# Patient Record
Sex: Female | Born: 1975 | Race: White | Hispanic: No | Marital: Married | Smoking: Former smoker
Health system: Southern US, Community
[De-identification: ages and names within clinical notes are randomized; demographics above are authoritative.]

## PROBLEM LIST (undated history)

## (undated) DIAGNOSIS — R519 Headache, unspecified: Secondary | ICD-10-CM

## (undated) DIAGNOSIS — R51 Headache: Secondary | ICD-10-CM

## (undated) DIAGNOSIS — I10 Essential (primary) hypertension: Secondary | ICD-10-CM

## (undated) HISTORY — DX: Headache, unspecified: R51.9

## (undated) HISTORY — PX: WISDOM TOOTH EXTRACTION: SHX21

## (undated) HISTORY — DX: Headache: R51

---

## 2001-11-05 ENCOUNTER — Encounter: Payer: Self-pay | Admitting: Emergency Medicine

## 2001-11-05 ENCOUNTER — Emergency Department (HOSPITAL_COMMUNITY): Admission: EM | Admit: 2001-11-05 | Discharge: 2001-11-05 | Payer: Self-pay | Admitting: Emergency Medicine

## 2010-10-17 ENCOUNTER — Emergency Department (HOSPITAL_COMMUNITY): Admission: EM | Admit: 2010-10-17 | Discharge: 2010-10-17 | Payer: Self-pay | Admitting: Emergency Medicine

## 2011-02-21 LAB — STREP A DNA PROBE: Group A Strep Probe: NEGATIVE

## 2011-02-21 LAB — MONONUCLEOSIS SCREEN: Mono Screen: NEGATIVE

## 2011-02-21 LAB — RAPID STREP SCREEN (MED CTR MEBANE ONLY): Streptococcus, Group A Screen (Direct): NEGATIVE

## 2011-09-14 ENCOUNTER — Other Ambulatory Visit: Payer: Self-pay | Admitting: Family Medicine

## 2011-09-14 DIAGNOSIS — N926 Irregular menstruation, unspecified: Secondary | ICD-10-CM

## 2011-09-14 DIAGNOSIS — R102 Pelvic and perineal pain: Secondary | ICD-10-CM

## 2011-09-18 ENCOUNTER — Other Ambulatory Visit: Payer: Self-pay

## 2011-09-21 ENCOUNTER — Other Ambulatory Visit: Payer: Self-pay

## 2011-09-28 ENCOUNTER — Ambulatory Visit
Admission: RE | Admit: 2011-09-28 | Discharge: 2011-09-28 | Disposition: A | Discharge status: Home / Self Care | Payer: Managed Care, Other (non HMO) | Source: Ambulatory Visit | Attending: Family Medicine | Admitting: Family Medicine

## 2011-09-28 DIAGNOSIS — N926 Irregular menstruation, unspecified: Secondary | ICD-10-CM

## 2011-09-28 DIAGNOSIS — R102 Pelvic and perineal pain: Secondary | ICD-10-CM

## 2012-10-07 IMAGING — US US PELVIS COMPLETE
1 series · 14 of 25 positions shown · non-contrast
Comparison: None.

CLINICAL DATA: Pelvic pain, irregular menses

TRANSABDOMINAL ULTRASOUND OF PELVIS
TECHNIQUE: Transabdominal ultrasound examination of the pelvis was
performed including evaluation of the uterus, ovaries, adnexal
regions, and pelvic cul-de-sac.

[Series 1: us pelvis complete · 0.34mm/px · 14 of 66 slices shown]
[im 1/66]
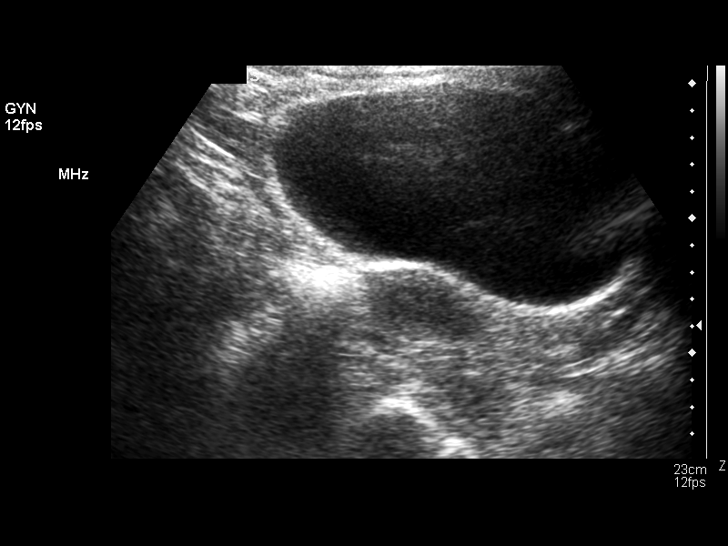
[im 6/66]
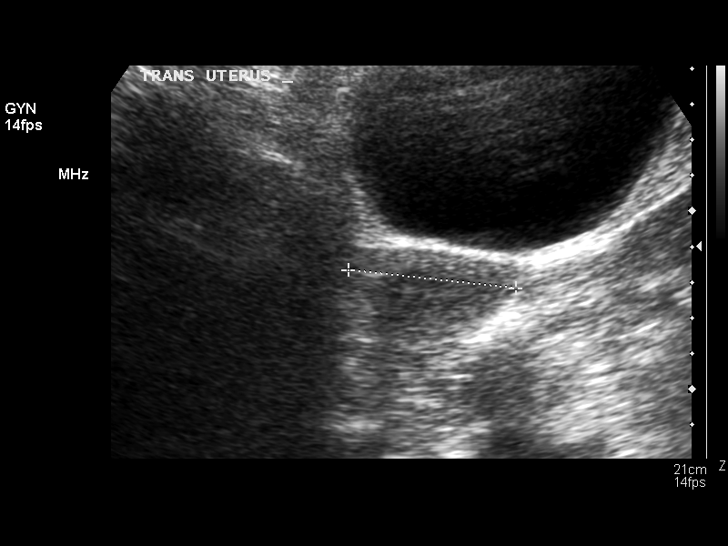
[im 11/66]
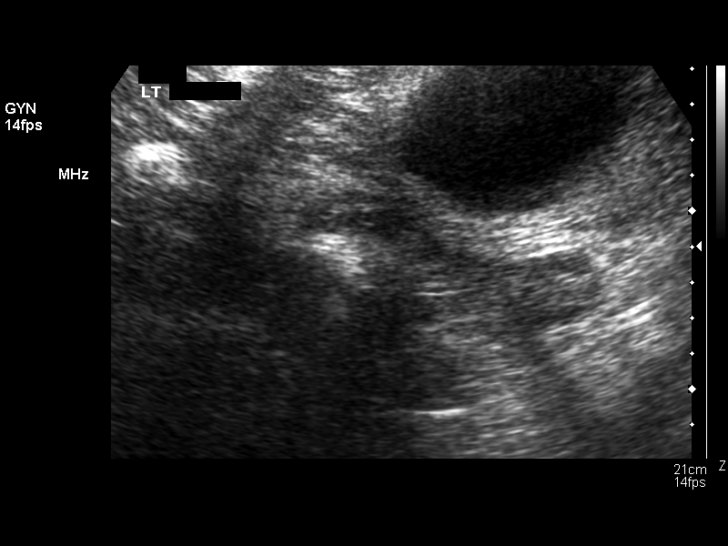
[im 17/66]
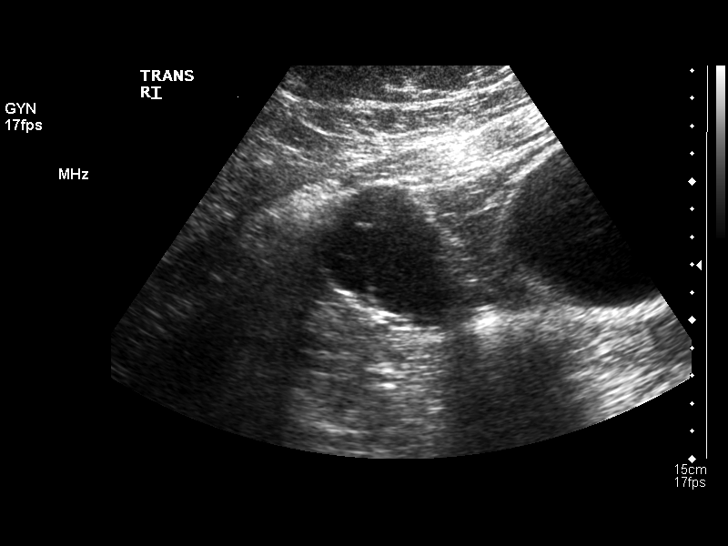
[im 22/66]
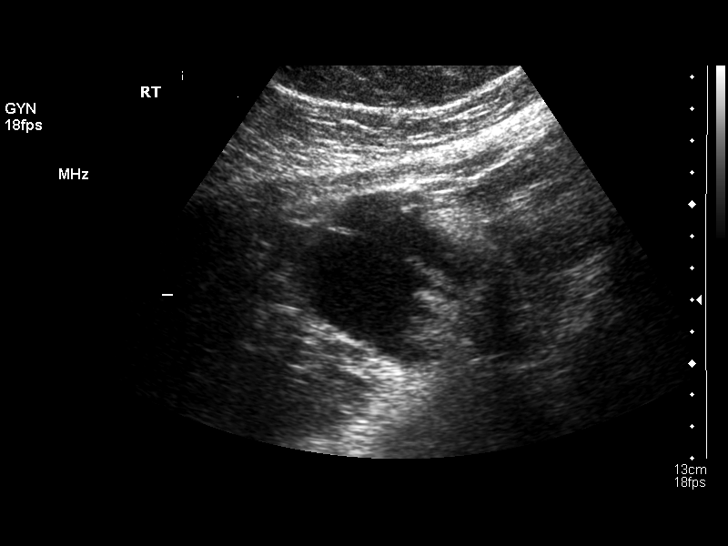
[im 25/66]
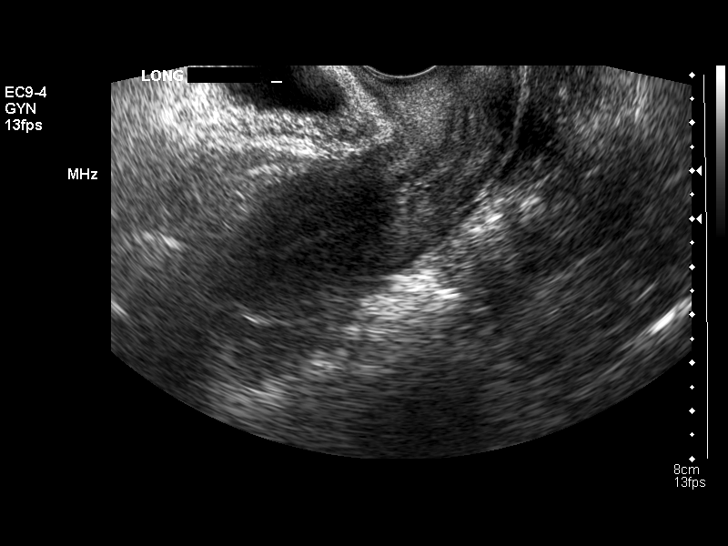
[im 30/66]
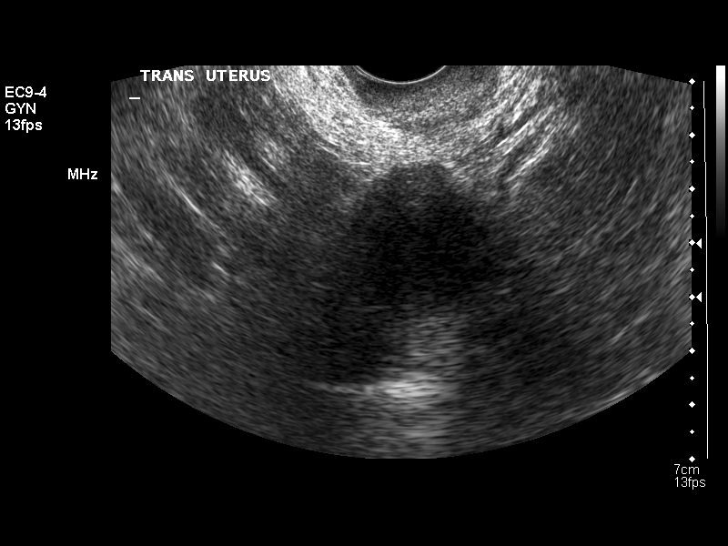
[im 36/66]
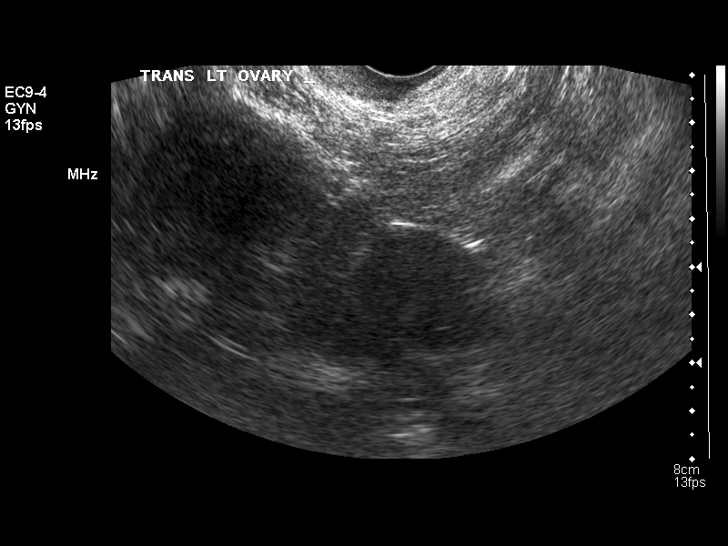
[im 41/66]
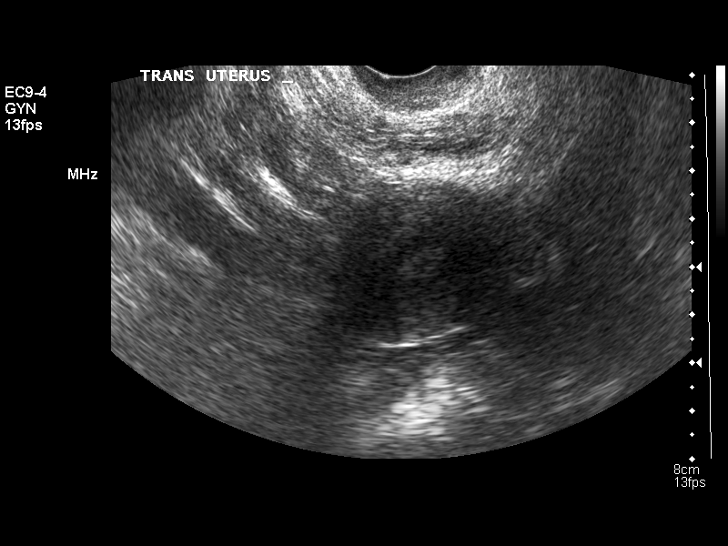
[im 44/66]
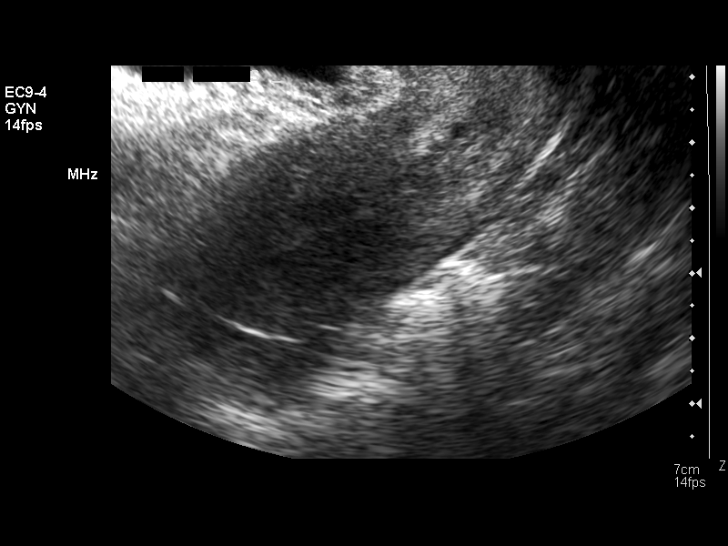
[im 49/66]
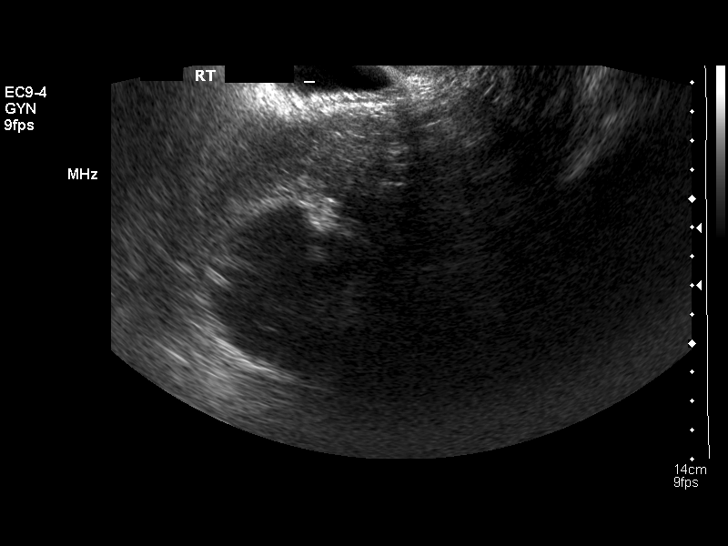
[im 55/66]
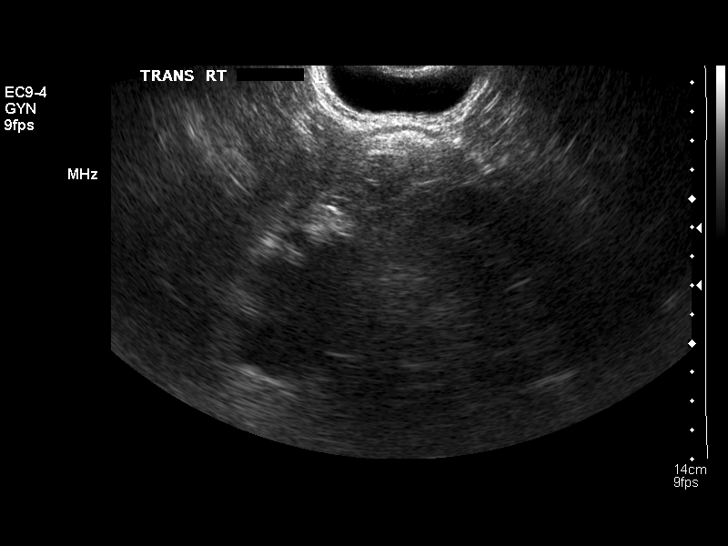
[im 60/66]
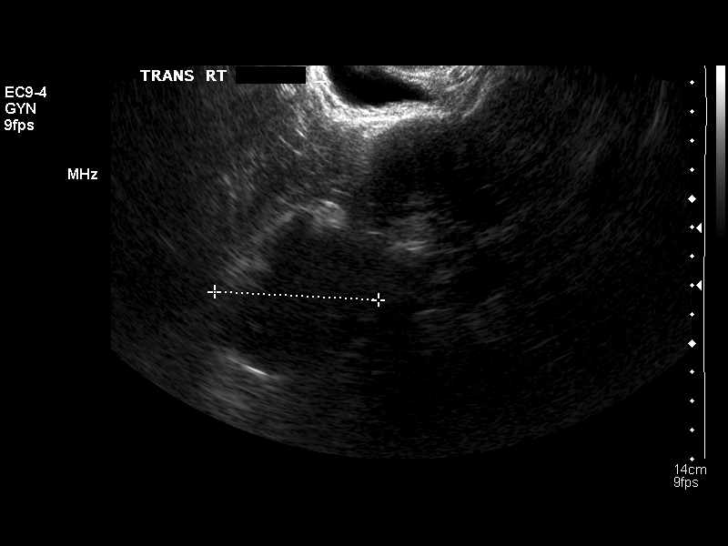
[im 66/66]
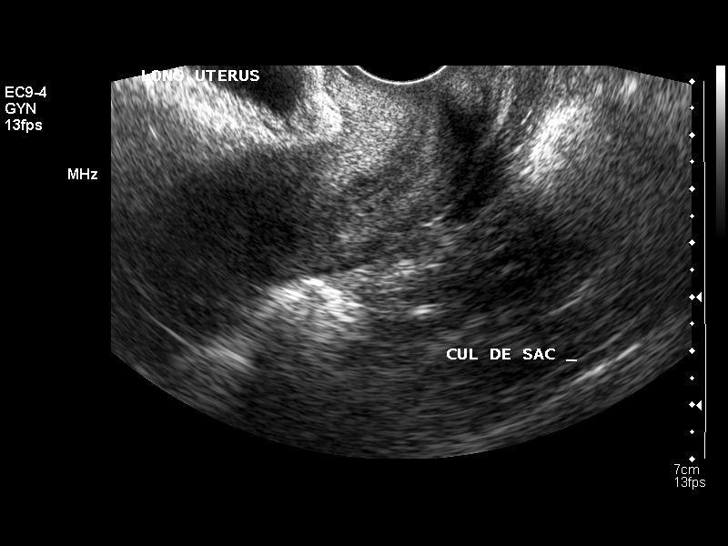

[14 of 25 positions shown; findings below may reference images not displayed]

FINDINGS: Uterus:  Normal size, shape and position. Uterus measures 10.0 x
2.8 x 4.7 cm.  No contour abnormality or fibroid demonstrated.

Endometrium: 3.4 mm thickness.  Normal appearance by ultrasound.
No focal abnormality.

Right ovary: Best demonstrated transabdominally because of anterior
superior location.  Right ovary is enlarged measuring 6.2 x 5.8 x
5.7 cm.  This is secondary to a complex septated right ovarian cyst
measuring 6 x 4 cm.

Left ovary: Measures 3.0 x 2.8 x 3.0 cm.  Normal appearance by
ultrasound.  No focal abnormality or left adnexal finding.

Other Findings:  No free fluid
IMPRESSION: Normal uterus and endometrium.  Negative for fibroids.

6 cm septated complex right ovarian cyst

No free fluid

## 2012-12-23 ENCOUNTER — Emergency Department (HOSPITAL_BASED_OUTPATIENT_CLINIC_OR_DEPARTMENT_OTHER)
Admission: EM | Admit: 2012-12-23 | Discharge: 2012-12-23 | Disposition: A | Discharge status: Home / Self Care | Payer: Managed Care, Other (non HMO) | Attending: Emergency Medicine | Admitting: Emergency Medicine

## 2012-12-23 ENCOUNTER — Encounter (HOSPITAL_BASED_OUTPATIENT_CLINIC_OR_DEPARTMENT_OTHER): Payer: Self-pay | Admitting: *Deleted

## 2012-12-23 DIAGNOSIS — I10 Essential (primary) hypertension: Secondary | ICD-10-CM | POA: Insufficient documentation

## 2012-12-23 DIAGNOSIS — H53149 Visual discomfort, unspecified: Secondary | ICD-10-CM | POA: Insufficient documentation

## 2012-12-23 DIAGNOSIS — R112 Nausea with vomiting, unspecified: Secondary | ICD-10-CM | POA: Insufficient documentation

## 2012-12-23 DIAGNOSIS — R51 Headache: Secondary | ICD-10-CM | POA: Insufficient documentation

## 2012-12-23 DIAGNOSIS — R209 Unspecified disturbances of skin sensation: Secondary | ICD-10-CM | POA: Insufficient documentation

## 2012-12-23 DIAGNOSIS — G43909 Migraine, unspecified, not intractable, without status migrainosus: Secondary | ICD-10-CM | POA: Insufficient documentation

## 2012-12-23 DIAGNOSIS — Z79899 Other long term (current) drug therapy: Secondary | ICD-10-CM | POA: Insufficient documentation

## 2012-12-23 DIAGNOSIS — F17200 Nicotine dependence, unspecified, uncomplicated: Secondary | ICD-10-CM | POA: Insufficient documentation

## 2012-12-23 HISTORY — DX: Essential (primary) hypertension: I10

## 2012-12-23 MED ORDER — SUMATRIPTAN SUCCINATE 50 MG PO TABS: 50.0000 mg | ORAL_TABLET | ORAL | Status: DC | PRN

## 2012-12-23 MED ORDER — METOCLOPRAMIDE HCL 5 MG/ML IJ SOLN
10.0000 mg | Freq: Once | INTRAMUSCULAR | Status: AC
  Administered 2012-12-23: 10 mg via INTRAVENOUS
  Filled 2012-12-23: qty 2

## 2012-12-23 MED ORDER — PROMETHAZINE HCL 25 MG/ML IJ SOLN
25.0000 mg | Freq: Once | INTRAMUSCULAR | Status: AC
  Administered 2012-12-23: 25 mg via INTRAVENOUS
  Filled 2012-12-23: qty 1

## 2012-12-23 MED ORDER — DEXAMETHASONE SODIUM PHOSPHATE 10 MG/ML IJ SOLN
10.0000 mg | Freq: Once | INTRAMUSCULAR | Status: AC
  Administered 2012-12-23: 10 mg via INTRAVENOUS
  Filled 2012-12-23: qty 1

## 2012-12-23 MED ORDER — SODIUM CHLORIDE 0.9 % IV SOLN
INTRAVENOUS | Status: DC
  Administered 2012-12-23: 18:00:00 via INTRAVENOUS

## 2012-12-23 MED ORDER — DIPHENHYDRAMINE HCL 50 MG/ML IJ SOLN
25.0000 mg | Freq: Once | INTRAMUSCULAR | Status: AC
  Administered 2012-12-23: 25 mg via INTRAVENOUS
  Filled 2012-12-23: qty 1

## 2012-12-23 MED ORDER — HYDROMORPHONE HCL PF 1 MG/ML IJ SOLN
1.0000 mg | Freq: Once | INTRAMUSCULAR | Status: AC
  Administered 2012-12-23: 1 mg via INTRAVENOUS
  Filled 2012-12-23: qty 1

## 2012-12-23 NOTE — ED Notes (Signed)
Pt c/o " migraine" x 2 hrs  

## 2012-12-23 NOTE — ED Provider Notes (Signed)
History   This chart was scribed for Claudia Human, MD by Donne Anon, ED Scribe. This patient was seen in room MH09/MH09 and the patient's care was started at 1748.   CSN: [id number]  Arrival date & time 12/23/12  1739   First MD Initiated Contact with Patient 12/23/12 1748      Chief Complaint  Patient presents with  . Migraine     Patient is a 37 y.o. female presenting with migraines. The history is provided by the patient. No language interpreter was used.  Migraine This is a new problem. The current episode started 1 to 2 hours ago. The problem occurs constantly. The problem has not changed since onset.Associated symptoms include headaches.   Labrenda Lasky is a 37 y.o. female who presents to the Emergency Department complaining of a gradual onset, constant, moderate frontal migraine which began 2 hours PTA. She reports associated emesis (1 episode) and photophobia. She states she has had similar migraines in the past and this one feels similar but she has not had any recently. She reports associated numbness in her left thigh which began 2 weeks ago. She reports she has recently been diagnosed with HTN and is otherwise in good health. She has a family history of migraines. Pt is a current everyday smoker (1pack/day) but denies alcohol use.   Past Medical History  Diagnosis Date  . Hypertension     History reviewed. No pertinent past surgical history.  History reviewed. No pertinent family history.  History  Substance Use Topics  . Smoking status: Current Every Day Smoker -- 0.5 packs/day    Types: Cigarettes  . Smokeless tobacco: Not on file  . Alcohol Use: No    OB History    Grav Para Term Preterm Abortions TAB SAB Ect Mult Living                  Review of Systems  Constitutional: Negative for fever.  HENT: Negative for sore throat.   Gastrointestinal: Positive for vomiting.  Neurological: Positive for numbness (left thigh) and headaches.  All other  systems reviewed and are negative.    Allergies  Review of patient's allergies indicates no known allergies.  Home Medications   Current Outpatient Rx  Name  Route  Sig  Dispense  Refill  . LISINOPRIL 10 MG PO TABS   Oral   Take 15 mg by mouth daily.           Triage Vitals: BP 158/90  Pulse 75  Temp 97.9 F (36.6 C) (Oral)  Resp 16  Ht 5\' 9"  (1.753 m)  Wt 250 lb (113.399 kg)  BMI 36.92 kg/m2  SpO2 100%  LMP 12/21/2012  Physical Exam  Nursing note and vitals reviewed. Constitutional: She is oriented to person, place, and time. She appears well-developed and well-nourished. No distress.  HENT:  Head: Normocephalic and atraumatic.  Right Ear: External ear normal.  Left Ear: External ear normal.  Eyes: Conjunctivae normal are normal. Pupils are equal, round, and reactive to light. Right eye exhibits no discharge. Left eye exhibits no discharge. No scleral icterus.  Neck: Normal range of motion. Neck supple. No tracheal deviation present.  Cardiovascular: Normal rate, regular rhythm, normal heart sounds and intact distal pulses.   Pulmonary/Chest: Effort normal and breath sounds normal. No stridor. No respiratory distress. She has no wheezes. She has no rales.  Abdominal: Soft. Bowel sounds are normal. She exhibits no distension. There is no tenderness. There is no rebound and  no guarding.  Musculoskeletal: Normal range of motion. She exhibits no edema and no tenderness.  Neurological: She is alert and oriented to person, place, and time. She has normal strength. No sensory deficit. Cranial nerve deficit:  no gross defecits noted. She exhibits normal muscle tone. She displays no seizure activity. Coordination normal.  Skin: Skin is warm and dry. No rash noted.  Psychiatric: She has a normal mood and affect. Her behavior is normal.    ED Course  Procedures (including critical care time) DIAGNOSTIC STUDIES: Oxygen Saturation is 100% on room air, normal by my  interpretation.    COORDINATION OF CARE: 5:51 PM Discussed treatment plan which includes medication with pt at bedside and pt agreed to plan.   Meds ordered this encounter  Medications  . lisinopril (PRINIVIL,ZESTRIL) 10 MG tablet    Sig: Take 15 mg by mouth daily.  Marland Kitchen 0.9 %  sodium chloride infusion    Sig:   . metoCLOPramide (REGLAN) injection 10 mg    Sig:   . dexamethasone (DECADRON) injection 10 mg    Sig:   . diphenhydrAMINE (BENADRYL) injection 25 mg    Sig:     7:30 PM No relief with migraine cocktail.  Rx Dilaudid 1 mg IV and Phenergan 25 mg IV as rescue medication.  8:46 PM Headache relieved.  Released with Rx for Imitrex 50 mg prn migraine headache.  F/U with her PCP at [facility].   IMP:  Migraine headache.  I personally performed the services described in this documentation, which was scribed in my presence. The recorded information has been reviewed and is accurate.  Claudia Human, MD      Carleene Cooper III, MD 12/23/12 [phone]

## 2013-01-02 LAB — HM PAP SMEAR: HM Pap smear: NORMAL

## 2015-01-04 ENCOUNTER — Emergency Department: Payer: Self-pay | Admitting: Emergency Medicine

## 2015-09-08 ENCOUNTER — Ambulatory Visit: Payer: Managed Care, Other (non HMO) | Admitting: Internal Medicine

## 2015-09-14 ENCOUNTER — Encounter: Payer: Self-pay | Admitting: Internal Medicine

## 2015-09-14 ENCOUNTER — Ambulatory Visit (INDEPENDENT_AMBULATORY_CARE_PROVIDER_SITE_OTHER): Payer: Managed Care, Other (non HMO) | Admitting: Internal Medicine

## 2015-09-14 ENCOUNTER — Encounter (INDEPENDENT_AMBULATORY_CARE_PROVIDER_SITE_OTHER): Payer: Self-pay

## 2015-09-14 VITALS — BP 144/100 | HR 79 | Temp 98.4°F | Ht 67.0 in | Wt 251.0 lb

## 2015-09-14 DIAGNOSIS — R519 Headache, unspecified: Secondary | ICD-10-CM

## 2015-09-14 DIAGNOSIS — I10 Essential (primary) hypertension: Secondary | ICD-10-CM

## 2015-09-14 DIAGNOSIS — L989 Disorder of the skin and subcutaneous tissue, unspecified: Secondary | ICD-10-CM

## 2015-09-14 DIAGNOSIS — R51 Headache: Secondary | ICD-10-CM

## 2015-09-14 LAB — COMPREHENSIVE METABOLIC PANEL
ALBUMIN: 4 g/dL (ref 3.5–5.2)
ALT: 22 U/L (ref 0–35)
AST: 18 U/L (ref 0–37)
Alkaline Phosphatase: 117 U/L (ref 39–117)
BILIRUBIN TOTAL: 0.4 mg/dL (ref 0.2–1.2)
BUN: 16 mg/dL (ref 6–23)
CALCIUM: 9.7 mg/dL (ref 8.4–10.5)
CHLORIDE: 104 meq/L (ref 96–112)
CO2: 30 mEq/L (ref 19–32)
CREATININE: 0.75 mg/dL (ref 0.40–1.20)
GFR: 91.5 mL/min (ref >60.00)
Glucose, Bld: 92 mg/dL (ref 70–99)
Potassium: 4.6 mEq/L (ref 3.5–5.1)
SODIUM: 138 meq/L (ref 135–145)
Total Protein: 6.9 g/dL (ref 6.0–8.3)

## 2015-09-14 LAB — CBC
HCT: 45.4 % (ref 36.0–46.0)
Hemoglobin: 15 g/dL (ref 12.0–15.0)
MCHC: 33 g/dL (ref 30.0–36.0)
MCV: 92.6 fl (ref 78.0–100.0)
PLATELETS: 251 10*3/uL (ref 150.0–400.0)
RBC: 4.91 Mil/uL (ref 3.87–5.11)
RDW: 14.1 % (ref 11.5–15.5)
WBC: 11.2 10*3/uL — AB (ref 4.0–10.5)

## 2015-09-14 NOTE — Progress Notes (Signed)
Pre visit review using our clinic review tool, if applicable. No additional management support is needed unless otherwise documented below in the visit note. 

## 2015-09-14 NOTE — Patient Instructions (Signed)

## 2015-09-14 NOTE — Assessment & Plan Note (Signed)
Appears hormonal Discussed starting OCP's to see if this will help She has an appt with GYN in 2 weeks, she would like to discuss with them

## 2015-09-14 NOTE — Progress Notes (Signed)
HPI  Pt presents to the clinic today to establish care and for management of the conditions listed below. Claudia Glenn is transferring care from [facility]:  HTN: Claudia Glenn has been prescribed Lisinopril in the past. Claudia Glenn reports it caused severe chest tightness. Claudia Glenn was switched to HCTZ but stopped taking it because it made her urinate too frequently. Claudia Glenn has not taken any BP medication in the last 6 months. Her BP today is 144/100.  Frequent Headaches: The start in her forehead. It radiates down to the back of her head. Claudia Glenn reports it feels like her head is about to bust open. Sometimes, Claudia Glenn has some associated nausea and vomiting. Claudia Glenn has sensitivity to light and sound. Claudia Glenn reports this occurs about once per month. Claudia Glenn reports it seems to be triggered by her periods. Claudia Glenn takes Advil liquid gels with some relief. Claudia Glenn has been prescribed Imitrex in the past but reports Claudia Glenn never did try it.  Claudia Glenn has a place on the left side of her face. Claudia Glenn noticed in 1-2 years ago. Claudia Glenn reports it is scaly. It has not changed in shape, size or color. Claudia Glenn has never had this looked at.  Claudia Glenn also reports a red patch on her right inner thigh. Claudia Glenn noticed this about 6 months ago. Claudia Glenn reports that it does not itch or burn.  Claudia Glenn reports it has not changes in color, shape or size. Claudia Glenn has not tried anything OTC.  Flu: never Tetanus: within the last 5 years LMP: 09/07/2015 Pap Smear: 2014, has appt on 10/18          Dentist: yearly  Past Medical History  Diagnosis Date  . Hypertension   . Frequent headaches     Current Outpatient Prescriptions  Medication Sig Dispense Refill  . lisinopril (PRINIVIL,ZESTRIL) 10 MG tablet Take 15 mg by mouth daily.     No current facility-administered medications for this visit.    No Known Allergies  Family History  Problem Relation Age of Onset  . Irregular heart beat Mother     Social History   Social History  . Marital Status: Married    Spouse Name: N/A  .  Number of Children: N/A  . Years of Education: N/A   Occupational History  . Not on file.   Social History Main Topics  . Smoking status: Former Smoker -- 0.50 packs/day    Types: Cigarettes  . Smokeless tobacco: Never Used     Comment: quit 2014  . Alcohol Use: 0.0 oz/week    0 Standard drinks or equivalent per week     Comment: rare  . Drug Use: No  . Sexual Activity: No   Other Topics Concern  . Not on file   Social History Narrative    ROS:  Constitutional: Pt reports headaches. Denies fever, malaise, fatigue, or abrupt weight changes.  HEENT: Denies eye pain, eye redness, ear pain, ringing in the ears, wax buildup, runny nose, nasal congestion, bloody nose, or sore throat. Respiratory: Denies difficulty breathing, shortness of breath, cough or sputum production.   Cardiovascular: Denies chest pain, chest tightness, palpitations or swelling in the hands or feet.  Gastrointestinal: Denies abdominal pain, bloating, constipation, diarrhea or blood in the stool.  GU: Denies frequency, urgency, pain with urination, blood in urine, odor or discharge. Musculoskeletal: Denies decrease in range of motion, difficulty with gait, muscle pain or joint pain and swelling.  Skin: Pt reports skin lesion on face and right leg. Denies ulcercations.  Neurological: Denies dizziness,  difficulty with memory, difficulty with speech or problems with balance and coordination.  Psych: Denies anxiety, depression, SI/HI.  No other specific complaints in a complete review of systems (except as listed in HPI above).  PE:  BP 144/100 mmHg  Pulse 79  Temp(Src) 98.4 F (36.9 C) (Oral)  Ht 5' 7"  (1.702 m)  Wt 251 lb (113.853 kg)  BMI 39.30 kg/m2  SpO2 98%  LMP 09/07/2015  Wt Readings from Last 3 Encounters:  09/14/15 251 lb (113.853 kg)  12/23/12 250 lb (113.399 kg)    General: Appears her stated age, obese in NAD. Skin: Round, raised, oval scaly lesion noted on left side of face. Grouped  mauclopapular lesions noted on medial right thigh. Cardiovascular: Normal rate and rhythm. S1,S2 noted.  No murmur, rubs or gallops noted.  Pulmonary/Chest: Normal effort and positive vesicular breath sounds. No respiratory distress. No wheezes, rales or ronchi noted.  Neurological: Alert and oriented. Coordination normal.  Psychiatric: Mood and affect normal. Behavior is normal. Judgment and thought content normal.    Assessment and Plan:  Skin lesion of face and leg:  Claudia Glenn will self refer to Dermatology Avoid picking at them if you can  RTC in 1 month to follow up BP

## 2015-09-14 NOTE — Assessment & Plan Note (Signed)
Will start Losartan 25 mg daily ECG today normal CBC and CMET today

## 2015-09-15 ENCOUNTER — Telehealth: Payer: Self-pay

## 2015-09-15 MED ORDER — LOSARTAN POTASSIUM 25 MG PO TABS: 25.0000 mg | ORAL_TABLET | Freq: Every day | ORAL | Status: DC

## 2015-09-15 NOTE — Telephone Encounter (Signed)
Pt seen 09/14/15; pt said was supposed to start losartan; but not at pharmacy. Spoke with pt and apologized and advised to ck with H/T [place] [phone] x 0. Pt voiced understanding. Pt has 1 month f/u appt on 10/19/15.sending to Avie Echevaria NP as Juluis Rainier.

## 2015-09-28 ENCOUNTER — Ambulatory Visit (INDEPENDENT_AMBULATORY_CARE_PROVIDER_SITE_OTHER): Payer: Managed Care, Other (non HMO) | Admitting: Obstetrics & Gynecology

## 2015-09-28 ENCOUNTER — Telehealth: Payer: Self-pay | Admitting: *Deleted

## 2015-09-28 ENCOUNTER — Encounter: Payer: Self-pay | Admitting: Obstetrics & Gynecology

## 2015-09-28 VITALS — BP 154/108 | HR 88 | Resp 20 | Ht 67.0 in | Wt 252.0 lb

## 2015-09-28 DIAGNOSIS — Z1151 Encounter for screening for human papillomavirus (HPV): Secondary | ICD-10-CM | POA: Diagnosis not present

## 2015-09-28 DIAGNOSIS — N946 Dysmenorrhea, unspecified: Secondary | ICD-10-CM | POA: Diagnosis not present

## 2015-09-28 DIAGNOSIS — Z01419 Encounter for gynecological examination (general) (routine) without abnormal findings: Secondary | ICD-10-CM | POA: Diagnosis not present

## 2015-09-28 DIAGNOSIS — Z124 Encounter for screening for malignant neoplasm of cervix: Secondary | ICD-10-CM | POA: Diagnosis not present

## 2015-09-28 DIAGNOSIS — Z0000 Encounter for general adult medical examination without abnormal findings: Secondary | ICD-10-CM

## 2015-09-28 LAB — TSH: TSH: 1.072 u[IU]/mL (ref 0.350–4.500)

## 2015-09-28 NOTE — Telephone Encounter (Signed)
Patient left a voicemail stating that she was in a couple of weeks and started on a blood pressure medication. Patient stated that she saw her GYN today and her BP was 160/10 and her GYN was concerned. Patient was told to call and report this to you and see what she should do about this?  Patient stated that she does not know if she needs a stronger dose of medication or something different. Patient stated that she works third shift and is getting ready to take a nap. Patient stated that if you don't get her when you call back she will try to call you before the office closes today.

## 2015-09-28 NOTE — Progress Notes (Signed)
Subjective:    Claudia Glenn is a MW P0  39 y.o. female who presents for an annual exam. The patient has no complaints today. She says that for the last few years her periods have gotten very heavy and painful, especially the first 3-4 days of her 7 day period. She has not used contraception since marriage in 2001. The patient is sexually active. GYN screening history: last pap: was normal. The patient wears seatbelts: yes. The patient participates in regular exercise: yes. Has the patient ever been transfused or tattooed?: yes. The patient reports that there is not domestic violence in her life.   Menstrual History: OB History    No data available      Menarche age: 62  Patient's last menstrual period was 09/07/2015.    The following portions of the patient's history were reviewed and updated as appropriate: allergies, current medications, past family history, past medical history, past social history, past surgical history and problem list.  Review of Systems Pertinent items noted in HPI and remainder of comprehensive ROS otherwise negative. She works for [employer] on [employer]. Married for about 14 years, denies dyspareunia. She will get her flu vaccine at work.    Objective:    BP 154/108 mmHg  Pulse 88  Resp 20  Ht 5' 7"  (1.702 m)  Wt 252 lb (114.306 kg)  BMI 39.46 kg/m2  LMP 09/07/2015  General Appearance:    Alert, cooperative, no distress, appears stated age  Head:    Normocephalic, without obvious abnormality, atraumatic  Eyes:    PERRL, conjunctiva/corneas clear, EOM's intact, fundi    benign, both eyes  Ears:    Normal TM's and external ear canals, both ears  Nose:   Nares normal, septum midline, mucosa normal, no drainage    or sinus tenderness  Throat:   Lips, mucosa, and tongue normal; teeth and gums normal  Neck:   Supple, symmetrical, trachea midline, no adenopathy;    thyroid:  no enlargement/tenderness/nodules; no carotid   bruit or JVD  Back:      Symmetric, no curvature, ROM normal, no CVA tenderness  Lungs:     Clear to auscultation bilaterally, respirations unlabored  Chest Wall:    No tenderness or deformity   Heart:    Regular rate and rhythm, S1 and S2 normal, no murmur, rub   or gallop  Breast Exam:    No tenderness, masses, or nipple abnormality  Abdomen:     Soft, non-tender, bowel sounds active all four quadrants,    no masses, no organomegaly, obese  Genitalia:    Normal female without lesion, discharge or tenderness, ULN size, difficult exam, no palpable masses     Extremities:   Extremities normal, atraumatic, no cyanosis or edema  Pulses:   2+ and symmetric all extremities  Skin:   Skin color, texture, turgor normal, no rashes or lesions  Lymph nodes:   Cervical, supraclavicular, and axillary nodes normal  Neurologic:   CNII-XII intact, normal strength, sensation and reflexes    throughout  .    Assessment:    Healthy female exam.   dysmenorrhea   Plan:     Breast self exam technique reviewed and patient encouraged to perform self-exam monthly. Thin prep Pap smear.   tsh Gyn u/s

## 2015-09-28 NOTE — Telephone Encounter (Signed)
I want her to come back here for a followup

## 2015-09-28 NOTE — Telephone Encounter (Signed)
Pt has f/u appt scheduled for tomorrow at 3:45

## 2015-09-29 ENCOUNTER — Ambulatory Visit (INDEPENDENT_AMBULATORY_CARE_PROVIDER_SITE_OTHER): Payer: Managed Care, Other (non HMO) | Admitting: Internal Medicine

## 2015-09-29 ENCOUNTER — Encounter: Payer: Self-pay | Admitting: Internal Medicine

## 2015-09-29 VITALS — BP 154/98 | HR 105 | Temp 98.1°F | Wt 250.0 lb

## 2015-09-29 DIAGNOSIS — I10 Essential (primary) hypertension: Secondary | ICD-10-CM | POA: Diagnosis not present

## 2015-09-29 LAB — CYTOLOGY - PAP

## 2015-09-29 MED ORDER — LOSARTAN POTASSIUM 100 MG PO TABS: 100.0000 mg | ORAL_TABLET | Freq: Every day | ORAL | Status: DC

## 2015-09-29 NOTE — Assessment & Plan Note (Signed)
Increase Losartan to 100 mg daily Discussed stress relieving techniques If you any chest pain or shortness of breath, to ER immediately  RTC in 2 weeks for followup

## 2015-09-29 NOTE — Patient Instructions (Signed)
Hypertension Hypertension, commonly called high blood pressure, is when the force of blood pumping through your arteries is too strong. Your arteries are the blood vessels that carry blood from your heart throughout your body. A blood pressure reading consists of a higher number over a lower number, such as 110/72. The higher number (systolic) is the pressure inside your arteries when your heart pumps. The lower number (diastolic) is the pressure inside your arteries when your heart relaxes. Ideally you want your blood pressure below 120/80. Hypertension forces your heart to work harder to pump blood. Your arteries may become narrow or stiff. Having untreated or uncontrolled hypertension can cause heart attack, stroke, kidney disease, and other problems. RISK FACTORS Some risk factors for high blood pressure are controllable. Others are not.  Risk factors you cannot control include:   Race. You may be at higher risk if you are African American.  Age. Risk increases with age.  Gender. Men are at higher risk than women before age 45 years. After age 65, women are at higher risk than men. Risk factors you can control include:  Not getting enough exercise or physical activity.  Being overweight.  Getting too much fat, sugar, calories, or salt in your diet.  Drinking too much alcohol. SIGNS AND SYMPTOMS Hypertension does not usually cause signs or symptoms. Extremely high blood pressure (hypertensive crisis) may cause headache, anxiety, shortness of breath, and nosebleed. DIAGNOSIS To check if you have hypertension, your health care provider will measure your blood pressure while you are seated, with your arm held at the level of your heart. It should be measured at least twice using the same arm. Certain conditions can cause a difference in blood pressure between your right and left arms. A blood pressure reading that is higher than normal on one occasion does not mean that you need treatment. If  it is not clear whether you have high blood pressure, you may be asked to return on a different day to have your blood pressure checked again. Or, you may be asked to monitor your blood pressure at home for 1 or more weeks. TREATMENT Treating high blood pressure includes making lifestyle changes and possibly taking medicine. Living a healthy lifestyle can help lower high blood pressure. You may need to change some of your habits. Lifestyle changes may include:  Following the DASH diet. This diet is high in fruits, vegetables, and whole grains. It is low in salt, red meat, and added sugars.  Keep your sodium intake below 2,300 mg per day.  Getting at least 30-45 minutes of aerobic exercise at least 4 times per week.  Losing weight if necessary.  Not smoking.  Limiting alcoholic beverages.  Learning ways to reduce stress. Your health care provider may prescribe medicine if lifestyle changes are not enough to get your blood pressure under control, and if one of the following is true:  You are 18-59 years of age and your systolic blood pressure is above 140.  You are 60 years of age or older, and your systolic blood pressure is above 150.  Your diastolic blood pressure is above 90.  You have diabetes, and your systolic blood pressure is over 140 or your diastolic blood pressure is over 90.  You have kidney disease and your blood pressure is above 140/90.  You have heart disease and your blood pressure is above 140/90. Your personal target blood pressure may vary depending on your medical conditions, your age, and other factors. HOME CARE INSTRUCTIONS    Have your blood pressure rechecked as directed by your health care provider.   Take medicines only as directed by your health care provider. Follow the directions carefully. Blood pressure medicines must be taken as prescribed. The medicine does not work as well when you skip doses. Skipping doses also puts you at risk for  problems.  Do not smoke.   Monitor your blood pressure at home as directed by your health care provider. SEEK MEDICAL CARE IF:   You think you are having a reaction to medicines taken.  You have recurrent headaches or feel dizzy.  You have swelling in your ankles.  You have trouble with your vision. SEEK IMMEDIATE MEDICAL CARE IF:  You develop a severe headache or confusion.  You have unusual weakness, numbness, or feel faint.  You have severe chest or abdominal pain.  You vomit repeatedly.  You have trouble breathing. MAKE SURE YOU:   Understand these instructions.  Will watch your condition.  Will get help right away if you are not doing well or get worse.   This information is not intended to replace advice given to you by your health care provider. Make sure you discuss any questions you have with your health care provider.   Document Released: 11/27/2005 Document Revised: 04/13/2015 Document Reviewed: 09/19/2013 Elsevier Interactive Patient Education 2016 [employer]  

## 2015-09-29 NOTE — Progress Notes (Signed)
Pre visit review using our clinic review tool, if applicable. No additional management support is needed unless otherwise documented below in the visit note. 

## 2015-09-29 NOTE — Progress Notes (Signed)
Subjective:    Patient ID: Claudia Glenn, female    DOB: 02-20-1976, 39 y.o.   MRN: [id number]  HPI  Pt presents to the clinic today with c/o elevated blood pressure. She was started on Losartan for a BP of 144/100. She went to the GYN on 10/18 at which time her BP was 154/108. She has noticed a constant heaviness in her chest but she denies chest pain or palpitations. She has been under a lot of stress at work lately. ECG from 09/2015 reviewed. She has failed Lisinopril and HCTZ in the past. Her BP today is 154/98.  Review of Systems      Past Medical History  Diagnosis Date  . Hypertension   . Frequent headaches     Current Outpatient Prescriptions  Medication Sig Dispense Refill  . losartan (COZAAR) 25 MG tablet Take 1 tablet (25 mg total) by mouth daily. 40 tablet 0   No current facility-administered medications for this visit.    No Known Allergies  Family History  Problem Relation Age of Onset  . Irregular heart beat Mother   . Cancer Neg Hx   . Diabetes Neg Hx   . Heart disease Neg Hx   . Stroke Neg Hx     Social History   Social History  . Marital Status: Married    Spouse Name: N/A  . Number of Children: N/A  . Years of Education: N/A   Occupational History  . Not on file.   Social History Main Topics  . Smoking status: Former Smoker -- 0.50 packs/day    Types: Cigarettes  . Smokeless tobacco: Never Used     Comment: quit 2014  . Alcohol Use: 0.0 oz/week    0 Standard drinks or equivalent per week     Comment: rare  . Drug Use: No  . Sexual Activity: Yes    Birth Control/ Protection: None   Other Topics Concern  . Not on file   Social History Narrative     Constitutional: Denies fever, malaise, fatigue, headache or abrupt weight changes.  Respiratory: Denies difficulty breathing, shortness of breath, cough or sputum production.   Cardiovascular: Pt reports chest tightness. Denies chest pain, palpitations or swelling in the hands or feet.   Neurological: Denies dizziness, difficulty with memory, difficulty with speech or problems with balance and coordination.  Psych: Denies anxiety, depression, SI/HI.  No other specific complaints in a complete review of systems (except as listed in HPI above).  Objective:   Physical Exam  BP 154/98 mmHg  Pulse 105  Temp(Src) 98.1 F (36.7 C) (Oral)  Wt 250 lb (113.399 kg)  SpO2 98%  LMP 09/07/2015 Wt Readings from Last 3 Encounters:  09/29/15 250 lb (113.399 kg)  09/28/15 252 lb (114.306 kg)  09/14/15 251 lb (113.853 kg)    General: Appears her stated age, obese in NAD. Cardiovascular: Normal rate and rhythm. S1,S2 noted.  No murmur, rubs or gallops noted.  Pulmonary/Chest: Normal effort and positive vesicular breath sounds. No respiratory distress. No wheezes, rales or ronchi noted.  Neurological: Alert and oriented. Cranial nerves II-XII grossly  intact. Coordination normal.    BMET    Component Value Date/Time   NA 138 09/14/2015 1148   K 4.6 09/14/2015 1148   CL 104 09/14/2015 1148   CO2 30 09/14/2015 1148   GLUCOSE 92 09/14/2015 1148   BUN 16 09/14/2015 1148   CREATININE 0.75 09/14/2015 1148   CALCIUM 9.7 09/14/2015 1148    Lipid  Panel  No results found for: CHOL, TRIG, HDL, CHOLHDL, VLDL, LDLCALC  CBC    Component Value Date/Time   WBC 11.2* 09/14/2015 1148   RBC 4.91 09/14/2015 1148   HGB 15.0 09/14/2015 1148   HCT 45.4 09/14/2015 1148   PLT 251.0 09/14/2015 1148   MCV 92.6 09/14/2015 1148   MCHC 33.0 09/14/2015 1148   RDW 14.1 09/14/2015 1148    Hgb A1C No results found for: HGBA1C        Assessment & Plan:

## 2015-10-02 ENCOUNTER — Emergency Department
Admission: EM | Admit: 2015-10-02 | Discharge: 2015-10-02 | Disposition: A | Discharge status: Home / Self Care | Payer: Managed Care, Other (non HMO) | Attending: Emergency Medicine | Admitting: Emergency Medicine

## 2015-10-02 ENCOUNTER — Encounter: Payer: Self-pay | Admitting: *Deleted

## 2015-10-02 DIAGNOSIS — Z79899 Other long term (current) drug therapy: Secondary | ICD-10-CM | POA: Diagnosis not present

## 2015-10-02 DIAGNOSIS — R0789 Other chest pain: Secondary | ICD-10-CM | POA: Diagnosis not present

## 2015-10-02 DIAGNOSIS — Z87891 Personal history of nicotine dependence: Secondary | ICD-10-CM | POA: Insufficient documentation

## 2015-10-02 DIAGNOSIS — I10 Essential (primary) hypertension: Secondary | ICD-10-CM | POA: Diagnosis not present

## 2015-10-02 DIAGNOSIS — R079 Chest pain, unspecified: Secondary | ICD-10-CM | POA: Diagnosis present

## 2015-10-02 LAB — BASIC METABOLIC PANEL
Anion gap: 7 (ref 5–15)
BUN: 14 mg/dL (ref 6–20)
CALCIUM: 9.1 mg/dL (ref 8.9–10.3)
CHLORIDE: 104 mmol/L (ref 101–111)
CO2: 24 mmol/L (ref 22–32)
CREATININE: 0.71 mg/dL (ref 0.44–1.00)
Glucose, Bld: 105 mg/dL — ABNORMAL HIGH (ref 65–99)
Potassium: 3.9 mmol/L (ref 3.5–5.1)
SODIUM: 135 mmol/L (ref 135–145)

## 2015-10-02 LAB — TROPONIN I

## 2015-10-02 LAB — CBC
HCT: 45.3 % (ref 35.0–47.0)
HEMOGLOBIN: 15.1 g/dL (ref 12.0–16.0)
MCH: 30.5 pg (ref 26.0–34.0)
MCHC: 33.4 g/dL (ref 32.0–36.0)
MCV: 91.2 fL (ref 80.0–100.0)
PLATELETS: 224 10*3/uL (ref 150–440)
RBC: 4.97 MIL/uL (ref 3.80–5.20)
RDW: 14.3 % (ref 11.5–14.5)
WBC: 10.8 10*3/uL (ref 3.6–11.0)

## 2015-10-02 MED ORDER — ASPIRIN 81 MG PO CHEW
324.0000 mg | CHEWABLE_TABLET | Freq: Once | ORAL | Status: AC
  Administered 2015-10-02: 324 mg via ORAL
  Filled 2015-10-02: qty 4

## 2015-10-02 MED ORDER — LABETALOL HCL 100 MG PO TABS
100.0000 mg | ORAL_TABLET | Freq: Once | ORAL | Status: AC
  Administered 2015-10-02: 100 mg via ORAL
  Filled 2015-10-02: qty 1

## 2015-10-02 NOTE — ED Notes (Signed)
Pt reports chest tightness for the last 2 days , pt reports hypertension; pt had recent medication change wit Losartan

## 2015-10-02 NOTE — ED Provider Notes (Signed)
[facility] Emergency Department Claudia Glenn Note REMINDER - THIS NOTE IS NOT A FINAL MEDICAL RECORD UNTIL IT IS SIGNED. UNTIL THEN, THE CONTENT BELOW MAY REFLECT INFORMATION FROM A DOCUMENTATION TEMPLATE, NOT THE ACTUAL PATIENT VISIT. ____________________________________________  Time seen: Approximately 12:27 PM  I have reviewed the triage vital signs and the nursing notes.   HISTORY  Chief Complaint Chest Pain    HPI Claudia Glenn is a 39 y.o. female the history of hypertension who presents today for evaluation of some light chest tightness that comes and goes for a few minutes for about the last 3 days.  Patient reports she has high blood pressure, she started Dr. about a week and half ago and had her losartan dose increased. She reports that it does not seem to be helping and she did check her blood patient home is continued her high about 165/100.  She does report that she is having occasional light discomfort across the chest, nonradiating, not associated trouble breathing, nausea, or sweats. Denies pregnancy. She does not any personal history of coronary disease and reports that there is no known heart disease in her family except for mom who has a pacemaker but has never had a heart attack. Denies a history of high cholesterol. He was a former smoker but is since quit. No exertional component to her chest pain.  Presently she is pain-free.  Past Medical History  Diagnosis Date  . Hypertension   . Frequent headaches     Patient Active Problem List   Diagnosis Date Noted  . HTN (hypertension) 09/14/2015  . Frequent headaches 09/14/2015    Past Surgical History  Procedure Laterality Date  . Wisdom tooth extraction      Current Outpatient Rx  Name  Route  Sig  Dispense  Refill  . ibuprofen (ADVIL,MOTRIN) 200 MG tablet   Oral   Take 400 mg by mouth every 6 (six) hours as needed for headache or cramping.         Marland Kitchen losartan (COZAAR) 100 MG  tablet   Oral   Take 1 tablet (100 mg total) by mouth daily.   30 tablet   0     Allergies Review of patient's allergies indicates no known allergies.  Family History  Problem Relation Age of Onset  . Irregular heart beat Mother   . Cancer Neg Hx   . Diabetes Neg Hx   . Heart disease Neg Hx   . Stroke Neg Hx     Social History Social History  Substance Use Topics  . Smoking status: Former Smoker -- 0.50 packs/day    Types: Cigarettes  . Smokeless tobacco: Never Used     Comment: quit 2014  . Alcohol Use: 0.0 oz/week    0 Standard drinks or equivalent per week     Comment: rare    Review of Systems Constitutional: No fever/chills Eyes: No visual changes. ENT: No sore throat. Cardiovascular: Not really pain, but occasional slight feeling of tightness in the chest.  Respiratory: Denies shortness of breath. Gastrointestinal: No abdominal pain.  No nausea, no vomiting.  No diarrhea.  No constipation. Genitourinary: Negative for dysuria. Musculoskeletal: Negative for back pain. Skin: Negative for rash. Neurological: Negative for headaches, focal weakness or numbness.  10-point ROS otherwise negative.  ____________________________________________   PHYSICAL EXAM:  VITAL SIGNS: ED Triage Vitals  Enc Vitals Group     BP 10/02/15 1141 165/102 mmHg     Pulse Rate 10/02/15 1141 77  Resp 10/02/15 1141 20     Temp 10/02/15 1141 98.8 F (37.1 C)     Temp src --      SpO2 10/02/15 1141 98 %     Weight 10/02/15 1138 250 lb (113.399 kg)     Height 10/02/15 1138 5' 7"  (1.702 m)     Head Cir --      Peak Flow --      Pain Score 10/02/15 1139 4     Pain Loc --      Pain Edu? --      Excl. in [place]? --    Constitutional: Alert and oriented. Well appearing and in no acute distress. Eyes: Conjunctivae are normal. PERRL. EOMI. Head: Atraumatic. Nose: No congestion/rhinnorhea. Mouth/Throat: Mucous membranes are moist.  Oropharynx non-erythematous. Neck: No stridor.    Cardiovascular: Normal rate, regular rhythm. Grossly normal heart sounds.  Good peripheral circulation. Respiratory: Normal respiratory effort.  No retractions. Lungs CTAB. Gastrointestinal: Soft and nontender. No distention. No abdominal bruits. No CVA tenderness. Musculoskeletal: No lower extremity tenderness nor edema.  No joint effusions. Neurologic:  Normal speech and language. No gross focal neurologic deficits are appreciated. No gait instability. Skin:  Skin is warm, dry and intact. No rash noted. Psychiatric: Mood and affect are normal. Speech and behavior are normal.  Essentially normal and very reassuring exam. ____________________________________________   LABS (all labs ordered are listed, but only abnormal results are displayed)  Labs Reviewed  BASIC METABOLIC PANEL - Abnormal; Notable for the following:    Glucose, Bld 105 (*)    All other components within normal limits  CBC  TROPONIN I  TROPONIN I   ____________________________________________  EKG  ED ECG REPORT I, QUALE, MARK, the attending physician, personally viewed and interpreted this ECG.  Date: 10/02/2015 EKG Time: 1145 Rate: 75 Rhythm: normal sinus rhythm QRS Axis: normal Intervals: normal ST/T Wave abnormalities: normal Conduction Disutrbances: Incomplete right bundle Narrative Interpretation: unremarkable except for incomplete right bundle, no ischemic change  ____________________________________________  RADIOLOGY   ____________________________________________   PROCEDURES  Procedure(s) performed: None  Critical Care performed: No  ____________________________________________   INITIAL IMPRESSION / ASSESSMENT AND PLAN / ED COURSE  Pertinent labs & imaging results that were available during my care of the patient were reviewed by me and considered in my medical decision making (see chart for details).   Patient presents with hypertension, which she is been working with her  primary care doctor control but seemingly has not had much improvement with increased doses of losartan. In addition she reports having some occasional fleeting feelings of tightness in the chest. Seems to be fairly atypical of acute coronary syndrome, and her EKG is very reassuring with minimal risk factors for coronary disease. She is low risk by clinical criteria including heart score, we will obtain a troponin at 2 hours and if this is normal I discussed with her close follow-up with cardiology and her primary care doctor. The patient is very agreeable and return precautions advised.   Final clinical symptoms or history to suggest pulmonary embolism, acute dissection, pneumothorax, or other acute chest abnormality. Abdomen benign.   ----------------------------------------- 2:43 PM on 10/02/2015 -----------------------------------------  Patient's heart score low risk. Blood pressure now 768 systolic, patient asymptomatic at any chest pain or discomfort. I did discuss close return precautions, patient does not wish to start a second antihypertensive at this time and think this is reasonable. She will follow-up with her doctor on Monday via phone, she'll continue to monitor  her blood pressures at home, and I will also give her referral information to [facility] cardiology whom she would prefer to follow up with. Careful return precautions advised.  ----------------------------------------- 2:58 PM on 10/02/2015 -----------------------------------------  Ongoing care assigned to Dr. Corky Downs. Plan has been discussed with the patient including close chest pain return precautions, if her second troponin is negative plan is to discharge patient for close outpatient follow-up with her primary cardiology. ____________________________________________   FINAL CLINICAL IMPRESSION(S) / ED DIAGNOSES  Final diagnoses:  Chest discomfort  Essential hypertension      Delman Kitten, MD 10/02/15 1458

## 2015-10-02 NOTE — Discharge Instructions (Signed)
You have been seen in the Emergency Department (ED) today for chest pain.  As we have discussed todays test results are normal, but you may require further testing.  Please follow up with the recommended doctor as instructed above in these documents regarding todays emergent visit and your recent symptoms to discuss further management.  Continue to take your regular medications. If you are not doing so already, please also take a daily baby aspirin (81 mg), at least until you follow up with your doctor.  Return to the Emergency Department (ED) if you experience any further chest pain/pressure/tightness, difficulty breathing, or sudden sweating, or other symptoms that concern you.   Chest Pain Observation It is often hard to give a specific diagnosis for the cause of chest pain. Among other possibilities your symptoms might be caused by inadequate oxygen delivery to your heart (angina). Angina that is not treated or evaluated can lead to a heart attack (myocardial infarction) or death. Blood tests, electrocardiograms, and X-rays may have been done to help determine a possible cause of your chest pain. After evaluation and observation, your health care provider has determined that it is unlikely your pain was caused by an unstable condition that requires hospitalization. However, a full evaluation of your pain may need to be completed, with additional diagnostic testing as directed. It is very important to keep your follow-up appointments. Not keeping your follow-up appointments could result in permanent heart damage, disability, or death. If there is any problem keeping your follow-up appointments, you must call your health care provider. HOME CARE INSTRUCTIONS  Due to the slight chance that your pain could be angina, it is important to follow your health care provider's treatment plan and also maintain a healthy lifestyle:  Maintain or work toward achieving a healthy weight.  Stay physically active  and exercise regularly.  Decrease your salt intake.  Eat a balanced, healthy diet. Talk to a dietitian to learn about heart-healthy foods.  Increase your fiber intake by including whole grains, vegetables, fruits, and nuts in your diet.  Avoid situations that cause stress, anger, or depression.  Take medicines as advised by your health care provider. Report any side effects to your health care provider. Do not stop medicines or adjust the dosages on your own.  Quit smoking. Do not use nicotine patches or gum until you check with your health care provider.  Keep your blood pressure, blood sugar, and cholesterol levels within normal limits.  Limit alcohol intake to no more than 1 drink per day for women who are not pregnant and 2 drinks per day for men.  Do not abuse drugs. SEEK IMMEDIATE MEDICAL CARE IF: You have severe chest pain or pressure which may include symptoms such as:  You feel pain or pressure in your arms, neck, jaw, or back.  You have severe back or abdominal pain, feel sick to your stomach (nauseous), or throw up (vomit).  You are sweating profusely.  You are having a fast or irregular heartbeat.  You feel short of breath while at rest.  You notice increasing shortness of breath during rest, sleep, or with activity.  You have chest pain that does not get better after rest or after taking your usual medicine.  You wake from sleep with chest pain.  You are unable to sleep because you cannot breathe.  You develop a frequent cough or you are coughing up blood.  You feel dizzy, faint, or experience extreme fatigue.  You develop severe weakness, dizziness, fainting,  or chills. Any of these symptoms may represent a serious problem that is an emergency. Do not wait to see if the symptoms will go away. Call your local emergency services (911 in the [place] Do not drive yourself to the hospital. MAKE SURE YOU:  Understand these instructions.  Will watch your  condition.  Will get help right away if you are not doing well or get worse.   This information is not intended to replace advice given to you by your health care provider. Make sure you discuss any questions you have with your health care provider.   Document Released: 12/30/2010 Document Revised: 12/02/2013 Document Reviewed: 05/29/2013 Elsevier Interactive Patient Education [employer].

## 2015-10-04 ENCOUNTER — Telehealth: Payer: Self-pay | Admitting: Internal Medicine

## 2015-10-04 NOTE — Telephone Encounter (Signed)
She does not need to go to the ER, she needs to follow up in the office

## 2015-10-04 NOTE — Telephone Encounter (Signed)
PLEASE NOTE: All timestamps contained within this report are represented as [place] Standard Time. CONFIDENTIALTY NOTICE: This fax transmission is intended only for the addressee. It contains information that is legally privileged, confidential or otherwise protected from use or disclosure. If you are not the intended recipient, you are strictly prohibited from reviewing, disclosing, copying using or disseminating any of this information or taking any action in reliance on or regarding this information. If you have received this fax in error, please notify us immediately by telephone so that we can arrange for its return to [place]. Phone: [phone], Toll-Free: [phone], Fax: [phone] Page: 1 of 2 Call Id: [id number] [place] Patient Name: Claudia Glenn Gender: Female DOB: December 03, 1976 Age: 39 Y 10 M 25 D Return Phone Number: [phone] (Primary) Address: City/State/Zip: [place] Client [place] Night - Client Client Site [place] Physician Webb Silversmith Contact Type Call Call Type Triage / Clinical Relationship To Patient Self Return Phone Number [phone] (Primary) Chief Complaint Blood Pressure High Initial Comment Caller states she is being treated for high BP. She just recently had her losartan upped to 143m. Her BP is still high. She checked it yesterday and it was 178/113. PreDisposition Did not know what to do Nurse Assessment Nurse: BBurt Ek RN, Tabatha Date/Time (Eastern Time): 10/02/2015 10:21:29 AM Confirm and document reason for call. If symptomatic, describe symptoms. ---Caller states she is "being treated for high BP. She just recently had her losartan upped to 1036m States she was seen Wednesday and was changed from 2575mo 100m64md started the 100mg64mrsday morning- takes once a day in the morning. Her BP  is still high. She checked it yesterday and it was 178/113." States she [facility] two [facility]. States currently checked at home 157/117. States she had been off her meds for 2 years and started back on meds two weeks ago. States she had been having some chest heaviness that would come and go since seeing her doctor. States she had blood work and EKG and was told it was good. States her doctor told her she believed it was related to stress. Caller states she has continued to have chest heaviness off and on. Denies any chest heaviness-pain now. States when it does happen it doesn't last over 5 min- it comes and goes. Has the patient traveled out of the country within the last 30 days? ---Not Applicable Does the patient have any new or worsening symptoms? ---Yes Will a triage be completed? ---Yes Related visit to physician within the last 2 weeks? ---Yes Does the PT have any chronic conditions? (i.e. diabetes, asthma, etc.) ---Yes List chronic conditions. ---HTN Did the patient indicate they were pregnant? ---No PLEASE NOTE: All timestamps contained within this report are represented as [place] Time. CONFIDENTIALTY NOTICE: This fax transmission is intended only for the addressee. It contains information that is legally privileged, confidential or otherwise protected from use or disclosure. If you are not the intended recipient, you are strictly prohibited from reviewing, disclosing, copying using or disseminating any of this information or taking any action in reliance on or regarding this information. If you have received this fax in error, please notify us imKoreadiately by telephone so that we can arrange for its return to us. PKoreane: [phone]-Free: [phone]: [phone]: 2 of 2 Call Id: [id number] Guideline Title Affirmed Question Affirmed Notes  Nurse Date/Time Eilene Ghazi Time) High Blood Pressure [1] BP # 160 / 100 AND [2] cardiac  or neurologic symptoms (e.g., chest pain, difficulty breathing, unsteady gait, blurred vision) States she has had chest pain off and on. Denies any pain now. States it comes and goes and does NOT last over 11mn. Blevins, RN, Tabatha 10/02/2015 10:27:27 AM Disp. Time (Eilene GhaziTime) Disposition Final User 10/02/2015 10:33:30 AM Go to ED Now Yes BBurt Ek RN, TGabriel EaringUnderstands: Yes Disagree/Comply: Comply Care Advice Given Per Guideline GO TO ED NOW: You need to be seen in the Emergency Department. Go to the ER at ___________ [place] carefully. NOTE TO TRIAGER - DRIVING: * Another adult should drive. * If immediate transportation is not available via car or taxi, then the patient should be instructed to call EMS-911. CALL EMS 911 IF: * Patient passes out, starts acting confused or becomes too weak to stand. * You become worse. CARE ADVICE given per High Blood Pressure (Adult) guideline. After Care Instructions Given Call Event Type User Date / Time Description [facility]

## 2015-10-07 NOTE — Telephone Encounter (Signed)
She needs a follow up appt

## 2015-10-07 NOTE — Telephone Encounter (Signed)
Patient left a message stating that she has continued her BP medications and she is still having on & offghtness in her chest, and high BP readings.Patient wanted to know if she should stop the BP medication or if it needs to be upped more? Please advise.  I attempted to contact patient but was unable to reach.

## 2015-10-08 MED ORDER — LABETALOL HCL 100 MG PO TABS: 100.0000 mg | ORAL_TABLET | Freq: Two times a day (BID) | ORAL | Status: DC

## 2015-10-08 NOTE — Telephone Encounter (Addendum)
Let's stop losartan and start labetalol 187m twice daily. To ER if worsening chest pain. Otherwise schedule appt on Monday for follow up.

## 2015-10-08 NOTE — Addendum Note (Signed)
Addended by: Ria Bush on: 10/08/2015 05:14 PM   Modules accepted: Orders, Medications

## 2015-10-08 NOTE — Telephone Encounter (Signed)
Claudia Glenn pt--Pt reports that chest pain burning feeling has continued and BP was 152/103 this morning at pharmacy---pt states she has to work all day today and stopped her losartan this morning and feels a lot better---pt reports while in ED last week they gave her labetalol and BP was great and she did not have chest discomfort---please advise--i will be out of office this afternoon and unable to call pt if response is past 12pm---thanks

## 2015-10-08 NOTE — Telephone Encounter (Signed)
Message left advising patient patient. Will try again on Monday.

## 2015-10-11 NOTE — Telephone Encounter (Signed)
Spoke with patient and she was able to start med on 10/08/15. Her BP is already doing a little better but it is high again by the time she is due for her 2nd dose. No more chest pain at all but she said that within 2 hours of her taking the labetalol, she noticed a tingling in the top of her head. It goes away, but it happens every time. She wasn't sure if that was normal or okay, but she said she is able to tolerate it. She has  A follow up with Rollene Fare on 10/19/15.

## 2015-10-11 NOTE — Telephone Encounter (Addendum)
plz get some BP readings (with HR if able) to review to see if we need to titrate medication.

## 2015-10-12 NOTE — Telephone Encounter (Signed)
Message left advising patient to return my call with readings.

## 2015-10-15 NOTE — Telephone Encounter (Signed)
Today 141/99 PR 69  Averaging around this daily. Tends to go a little higher as it gets closer to time to take meds again. Still no CP and PR staying stable. Follow up scheduled for 10/20/15.

## 2015-10-15 NOTE — Telephone Encounter (Signed)
Noted. Will route to [facility] as [place]

## 2015-10-16 NOTE — Telephone Encounter (Signed)
Noted, will discuss at upcoming appt

## 2015-10-19 ENCOUNTER — Ambulatory Visit: Payer: Managed Care, Other (non HMO) | Admitting: Internal Medicine

## 2015-10-20 ENCOUNTER — Encounter: Payer: Self-pay | Admitting: Internal Medicine

## 2015-10-20 ENCOUNTER — Ambulatory Visit (INDEPENDENT_AMBULATORY_CARE_PROVIDER_SITE_OTHER): Payer: Managed Care, Other (non HMO) | Admitting: Internal Medicine

## 2015-10-20 VITALS — BP 142/80 | HR 64 | Temp 98.3°F | Wt 250.0 lb

## 2015-10-20 DIAGNOSIS — I10 Essential (primary) hypertension: Secondary | ICD-10-CM

## 2015-10-20 NOTE — Patient Instructions (Signed)
Hypertension Hypertension, commonly called high blood pressure, is when the force of blood pumping through your arteries is too strong. Your arteries are the blood vessels that carry blood from your heart throughout your body. A blood pressure reading consists of a higher number over a lower number, such as 110/72. The higher number (systolic) is the pressure inside your arteries when your heart pumps. The lower number (diastolic) is the pressure inside your arteries when your heart relaxes. Ideally you want your blood pressure below 120/80. Hypertension forces your heart to work harder to pump blood. Your arteries may become narrow or stiff. Having untreated or uncontrolled hypertension can cause heart attack, stroke, kidney disease, and other problems. RISK FACTORS Some risk factors for high blood pressure are controllable. Others are not.  Risk factors you cannot control include:   Race. You may be at higher risk if you are African American.  Age. Risk increases with age.  Gender. Men are at higher risk than women before age 45 years. After age 65, women are at higher risk than men. Risk factors you can control include:  Not getting enough exercise or physical activity.  Being overweight.  Getting too much fat, sugar, calories, or salt in your diet.  Drinking too much alcohol. SIGNS AND SYMPTOMS Hypertension does not usually cause signs or symptoms. Extremely high blood pressure (hypertensive crisis) may cause headache, anxiety, shortness of breath, and nosebleed. DIAGNOSIS To check if you have hypertension, your health care provider will measure your blood pressure while you are seated, with your arm held at the level of your heart. It should be measured at least twice using the same arm. Certain conditions can cause a difference in blood pressure between your right and left arms. A blood pressure reading that is higher than normal on one occasion does not mean that you need treatment. If  it is not clear whether you have high blood pressure, you may be asked to return on a different day to have your blood pressure checked again. Or, you may be asked to monitor your blood pressure at home for 1 or more weeks. TREATMENT Treating high blood pressure includes making lifestyle changes and possibly taking medicine. Living a healthy lifestyle can help lower high blood pressure. You may need to change some of your habits. Lifestyle changes may include:  Following the DASH diet. This diet is high in fruits, vegetables, and whole grains. It is low in salt, red meat, and added sugars.  Keep your sodium intake below 2,300 mg per day.  Getting at least 30-45 minutes of aerobic exercise at least 4 times per week.  Losing weight if necessary.  Not smoking.  Limiting alcoholic beverages.  Learning ways to reduce stress. Your health care provider may prescribe medicine if lifestyle changes are not enough to get your blood pressure under control, and if one of the following is true:  You are 18-59 years of age and your systolic blood pressure is above 140.  You are 60 years of age or older, and your systolic blood pressure is above 150.  Your diastolic blood pressure is above 90.  You have diabetes, and your systolic blood pressure is over 140 or your diastolic blood pressure is over 90.  You have kidney disease and your blood pressure is above 140/90.  You have heart disease and your blood pressure is above 140/90. Your personal target blood pressure may vary depending on your medical conditions, your age, and other factors. HOME CARE INSTRUCTIONS    Have your blood pressure rechecked as directed by your health care provider.   Take medicines only as directed by your health care provider. Follow the directions carefully. Blood pressure medicines must be taken as prescribed. The medicine does not work as well when you skip doses. Skipping doses also puts you at risk for  problems.  Do not smoke.   Monitor your blood pressure at home as directed by your health care provider. SEEK MEDICAL CARE IF:   You think you are having a reaction to medicines taken.  You have recurrent headaches or feel dizzy.  You have swelling in your ankles.  You have trouble with your vision. SEEK IMMEDIATE MEDICAL CARE IF:  You develop a severe headache or confusion.  You have unusual weakness, numbness, or feel faint.  You have severe chest or abdominal pain.  You vomit repeatedly.  You have trouble breathing. MAKE SURE YOU:   Understand these instructions.  Will watch your condition.  Will get help right away if you are not doing well or get worse.   This information is not intended to replace advice given to you by your health care provider. Make sure you discuss any questions you have with your health care provider.   Document Released: 11/27/2005 Document Revised: 04/13/2015 Document Reviewed: 09/19/2013 Elsevier Interactive Patient Education 2016 [employer]  

## 2015-10-20 NOTE — Progress Notes (Signed)
Pre visit review using our clinic review tool, if applicable. No additional management support is needed unless otherwise documented below in the visit note. 

## 2015-10-20 NOTE — Assessment & Plan Note (Signed)
Better control with Labetalol Will continue current therapy for now  RTC in 6 months or sooner if needed

## 2015-10-20 NOTE — Progress Notes (Signed)
Subjective:    Patient ID: Claudia Glenn, female    DOB: 15-Nov-1976, 39 y.o.   MRN: [id number]  HPI  Pt presents to the clinic to follow up blood pressure. She was seen 10/19 for the same. Her Losartan was increase to 100 mg BID. She went to the ER 10/22 with c/o chest tightness. ECG was normal. They were able to decrease her BP with oral medication and advised her to follow up with her PCP. She called while I was out, and Dr. Lucretia Roers put her on Labetalol 100 mg BID. She has been monitoring her BP's at home, it has been running 140/93. She has not had any chest pain, chest tightness or shortness of breath, since stopping the Losartan. She did noticed some mild tingling in her scalp once she started the Labetalol but reports that has since subsided. Her BP today is 142/80.  Review of Systems  Past Medical History  Diagnosis Date  . Hypertension   . Frequent headaches     Current Outpatient Prescriptions  Medication Sig Dispense Refill  . ibuprofen (ADVIL,MOTRIN) 200 MG tablet Take 400 mg by mouth every 6 (six) hours as needed for headache or cramping.    . labetalol (NORMODYNE) 100 MG tablet Take 1 tablet (100 mg total) by mouth 2 (two) times daily. 60 tablet 0   No current facility-administered medications for this visit.    Allergies  Allergen Reactions  . Losartan Other (See Comments)    Chest tightness    Family History  Problem Relation Age of Onset  . Irregular heart beat Mother   . Cancer Neg Hx   . Diabetes Neg Hx   . Heart disease Neg Hx   . Stroke Neg Hx     Social History   Social History  . Marital Status: Married    Spouse Name: N/A  . Number of Children: N/A  . Years of Education: N/A   Occupational History  . Not on file.   Social History Main Topics  . Smoking status: Former Smoker -- 0.50 packs/day    Types: Cigarettes  . Smokeless tobacco: Never Used     Comment: quit 2014  . Alcohol Use: 0.0 oz/week    0 Standard drinks or equivalent per  week     Comment: rare  . Drug Use: No  . Sexual Activity: Yes    Birth Control/ Protection: None   Other Topics Concern  . Not on file   Social History Narrative     Constitutional: Denies fever, malaise, fatigue, headache or abrupt weight changes.  Respiratory: Denies difficulty breathing, shortness of breath, cough or sputum production.   Cardiovascular: Denies chest pain, chest tightness, palpitations or swelling in the hands or feet.  Neurological: Denies dizziness, difficulty with memory, difficulty with speech or problems with balance and coordination.    No other specific complaints in a complete review of systems (except as listed in HPI above).     Objective:   Physical Exam   BP 142/80 mmHg  Pulse 64  Temp(Src) 98.3 F (36.8 C) (Oral)  Wt 250 lb (113.399 kg)  SpO2 98%  LMP 10/05/2015 Wt Readings from Last 3 Encounters:  10/20/15 250 lb (113.399 kg)  10/02/15 250 lb (113.399 kg)  09/29/15 250 lb (113.399 kg)    General: Appears her stated age, well developed, well nourished in NAD.  Cardiovascular: Normal rate and rhythm. S1,S2 noted.  No murmur, rubs or gallops noted.  Pulmonary/Chest: Normal effort and positive  vesicular breath sounds. No respiratory distress. No wheezes, rales or ronchi noted.  Neurological: Alert and oriented.    BMET    Component Value Date/Time   NA 135 10/02/2015 1212   K 3.9 10/02/2015 1212   CL 104 10/02/2015 1212   CO2 24 10/02/2015 1212   GLUCOSE 105* 10/02/2015 1212   BUN 14 10/02/2015 1212   CREATININE 0.71 10/02/2015 1212   CALCIUM 9.1 10/02/2015 1212   GFRNONAA >60 10/02/2015 1212   GFRAA >60 10/02/2015 1212    Lipid Panel  No results found for: CHOL, TRIG, HDL, CHOLHDL, VLDL, LDLCALC  CBC    Component Value Date/Time   WBC 10.8 10/02/2015 1212   RBC 4.97 10/02/2015 1212   HGB 15.1 10/02/2015 1212   HCT 45.3 10/02/2015 1212   PLT 224 10/02/2015 1212   MCV 91.2 10/02/2015 1212   MCH 30.5 10/02/2015 1212     MCHC 33.4 10/02/2015 1212   RDW 14.3 10/02/2015 1212    Hgb A1C No results found for: HGBA1C      Assessment & Plan:

## 2015-10-22 ENCOUNTER — Ambulatory Visit (HOSPITAL_COMMUNITY): Payer: Managed Care, Other (non HMO)

## 2015-10-22 ENCOUNTER — Ambulatory Visit: Payer: Managed Care, Other (non HMO) | Admitting: Family Medicine

## 2015-11-01 ENCOUNTER — Telehealth: Payer: Self-pay

## 2015-11-01 NOTE — Telephone Encounter (Signed)
PLEASE NOTE: All timestamps contained within this report are represented as [place] Standard Time. CONFIDENTIALTY NOTICE: This fax transmission is intended only for the addressee. It contains information that is legally privileged, confidential or otherwise protected from use or disclosure. If you are not the intended recipient, you are strictly prohibited from reviewing, disclosing, copying using or disseminating any of this information or taking any action in reliance on or regarding this information. If you have received this fax in error, please notify us immediately by telephone so that we can arrange for its return to [place]. Phone: [phone], Toll-Free: [phone], Fax: [phone] Page: 1 of 2 Call Id: [id number] Bee Patient Name: Claudia Glenn Gender: Female DOB: 05/22/1976 Age: 39 Y 11 M 24 D Return Phone Number: [phone] (Primary) Address: City/State/Zip: [place] Client [place] Night - Client Client Site [place] Physician Garnette Gunner, [place] Type Fax Call Type Triage / Clinical Relationship To Patient Self Return Phone Number [phone] (Primary) Chief Complaint Pain - Generalized Initial Comment *CBNW*Caller states may be having trouble with external hemorrhoids she thinks. Blood on the skin around private area. Not on period. Burning and raw around that area. Wanting to know what to take until she can get appt. *Remade from CID [id number]* PreDisposition Home Care Nurse Assessment Nurse: Germain Osgood, RN, Opal Sidles Date/Time Eilene Ghazi Time): 10/31/2015 5:25:30 PM Confirm and document reason for call. If symptomatic, describe symptoms. ---Caller states in last 2 weeks has had blood when wipes after BM's. Occasional drops of blood in stool. Is having raw burning sensation. Concerned may have hemorrhoids Has the patient  traveled out of the country within the last 30 days? ---No Does the patient have any new or worsening symptoms? ---Yes Will a triage be completed? ---Yes Related visit to physician within the last 2 weeks? ---No Does the PT have any chronic conditions? (i.e. diabetes, asthma, etc.) ---Yes List chronic conditions. ---Hx Hypertension NKDA Did the patient indicate they were pregnant? ---No Is this a behavioral health call? ---No Guidelines Guideline Title Affirmed Question Affirmed Notes Nurse Date/Time Eilene Ghazi Time) Rectal Bleeding [1] Rectal bleeding is minimal (e.g., blood just on toilet paper, few drops, streaks on surface of normal formed BM) AND [2] bleeding recurs 3 or more times on treatment Rowan Blase 10/31/2015 5:28:34 PM PLEASE NOTE: All timestamps contained within this report are represented as [place] Standard Time. CONFIDENTIALTY NOTICE: This fax transmission is intended only for the addressee. It contains information that is legally privileged, confidential or otherwise protected from use or disclosure. If you are not the intended recipient, you are strictly prohibited from reviewing, disclosing, copying using or disseminating any of this information or taking any action in reliance on or regarding this information. If you have received this fax in error, please notify us immediately by telephone so that we can arrange for its return to [place]. Phone: [phone], Toll-Free: [phone], Fax: [phone] Page: 2 of 2 Call Id: [id number] [place]. Time Eilene Ghazi Time) Disposition Final User 10/31/2015 5:22:03 PM Send To Call Back Waiting For Nurse Dorene Sorrow 10/31/2015 5:33:01 PM See PCP within 2 Weeks Yes Germain Osgood, RN, Irving Copas Understands: Yes Disagree/Comply: Comply Care Advice Given Per Guideline SEE PCP WITHIN 2 WEEKS: You need an evaluation for this ongoing problem within the next 2 weeks. Call your doctor during regular office hours and make an appointment.  WARM SALINE SITZ BATHS TWICE DAILY FOR  RECTAL SYMPTOMS: * Sit in a warm sitz bath for 20 minutes twice a day. This will decrease swelling and irritation, keep the area clean, and help with healing. * Afterwards, pat area dry with unscented toilet paper. TO SOFTEN STOOLS AND TREAT CONSTIPATION: * Eat a high fiber diet. * Drink adequate liquids (6-8 glasses of water a day) * Exercise regularly (even a daily 15 minute walk!) * Get into a rhythm - try to have a BM at the same time each day. * Eat more grain foods (bran flakes, bran muffins, graham crackers, oatmeal, brown rice, and whole wheat bread). Popcorn is a source of fiber. CALL BACK IF: * Bleeding increases in amount * You become worse. CARE ADVICE given per Rectal Bleeding (Adult) guideline. REASSURANCE: You have told me that there is only mild bleeding. Often this can be caused by either hemorrhoids or a small tear (fissure) in the skin of the anus (rectal opening). There are several things that you can do to make this better. After Care Instructions Given Call Event Type User Date / Time Description Comments User: Dorene Sorrow Date/Time Eilene Ghazi Time): 10/31/2015 5:22:27 PM *remade under Night client from [place] [id number]* Referrals REFERRED TO PCP OFFICE REFERRED TO PCP OFFICE REFERRED TO PCP OFFICE

## 2015-11-01 NOTE — Telephone Encounter (Signed)
Agree with advice given

## 2015-11-01 NOTE — Telephone Encounter (Signed)
Unable to reach pt by phone for update; no appt scheduled.

## 2015-11-01 NOTE — Telephone Encounter (Signed)
Pt called to schedule appt to see Claudia Echevaria NP; scheduled appt 11/02/15 at 3PM. Pt advised as instructed and will get prep H OTC; pt said no active bleeding now; pt is doing sitz baths, advised to drink plenty of water and increase fiber in diet; pt will cb if bleeding restarts.

## 2015-11-01 NOTE — Telephone Encounter (Signed)
She can try Preperation H OTC as needed

## 2015-11-02 ENCOUNTER — Ambulatory Visit (INDEPENDENT_AMBULATORY_CARE_PROVIDER_SITE_OTHER): Payer: Managed Care, Other (non HMO) | Admitting: Internal Medicine

## 2015-11-02 ENCOUNTER — Encounter: Payer: Self-pay | Admitting: Internal Medicine

## 2015-11-02 ENCOUNTER — Encounter (INDEPENDENT_AMBULATORY_CARE_PROVIDER_SITE_OTHER): Payer: Self-pay

## 2015-11-02 VITALS — BP 134/86 | HR 72 | Temp 98.2°F | Wt 252.0 lb

## 2015-11-02 DIAGNOSIS — K644 Residual hemorrhoidal skin tags: Secondary | ICD-10-CM

## 2015-11-02 DIAGNOSIS — K648 Other hemorrhoids: Secondary | ICD-10-CM | POA: Diagnosis not present

## 2015-11-02 MED ORDER — HYDROCORTISONE ACE-PRAMOXINE 1-1 % RE FOAM: 1.0000 | Freq: Two times a day (BID) | RECTAL | Status: DC

## 2015-11-02 MED ORDER — LABETALOL HCL 100 MG PO TABS: 100.0000 mg | ORAL_TABLET | Freq: Two times a day (BID) | ORAL | Status: DC

## 2015-11-02 NOTE — Progress Notes (Signed)
Pre visit review using our clinic review tool, if applicable. No additional management support is needed unless otherwise documented below in the visit note. 

## 2015-11-02 NOTE — Progress Notes (Signed)
Subjective:    Patient ID: Claudia Glenn, female    DOB: 28-Jan-1976, 39 y.o.   MRN: [id number]  HPI  Pt presents to the clinic today with c/o blood in her stool. This started 1 week ago. She has been having a BM daily. When she has a BM, she noticed bright red blood in the toilet and on the toilet tissue when she wipes. She denies constipation. She does not strain with a BM.  She has never had a history of hemorrhoids before. She has tried Preperation H without much relief.  Review of Systems      Past Medical History  Diagnosis Date  . Hypertension   . Frequent headaches     Current Outpatient Prescriptions  Medication Sig Dispense Refill  . ibuprofen (ADVIL,MOTRIN) 200 MG tablet Take 400 mg by mouth every 6 (six) hours as needed for headache or cramping.    . labetalol (NORMODYNE) 100 MG tablet Take 1 tablet (100 mg total) by mouth 2 (two) times daily. 60 tablet 0   No current facility-administered medications for this visit.    Allergies  Allergen Reactions  . Losartan Other (See Comments)    Chest tightness    Family History  Problem Relation Age of Onset  . Irregular heart beat Mother   . Cancer Neg Hx   . Diabetes Neg Hx   . Heart disease Neg Hx   . Stroke Neg Hx     Social History   Social History  . Marital Status: Married    Spouse Name: N/A  . Number of Children: N/A  . Years of Education: N/A   Occupational History  . Not on file.   Social History Main Topics  . Smoking status: Former Smoker -- 0.50 packs/day    Types: Cigarettes  . Smokeless tobacco: Never Used     Comment: quit 2014  . Alcohol Use: 0.0 oz/week    0 Standard drinks or equivalent per week     Comment: rare  . Drug Use: No  . Sexual Activity: Yes    Birth Control/ Protection: None   Other Topics Concern  . Not on file   Social History Narrative     Constitutional: Denies fever, malaise, fatigue, headache or abrupt weight changes.  HEENT: Denies eye pain, eye  redness, ear pain, ringing in the ears, wax buildup, runny nose, nasal congestion, bloody nose, or sore throat. Respiratory: Denies difficulty breathing, shortness of breath, cough or sputum production.   Cardiovascular: Denies chest pain, chest tightness, palpitations or swelling in the hands or feet.  Gastrointestinal: Pt reports blood in her stool. Denies abdominal pain, bloating, constipation, diarrhea.  GU: Denies urgency, frequency, pain with urination, burning sensation, blood in urine, odor or discharge.   No other specific complaints in a complete review of systems (except as listed in HPI above).  Objective:   Physical Exam   BP 134/86 mmHg  Pulse 72  Temp(Src) 98.2 F (36.8 C) (Oral)  Wt 252 lb (114.306 kg)  SpO2 98%  LMP 10/05/2015   Wt Readings from Last 3 Encounters:  11/02/15 252 lb (114.306 kg)  10/20/15 250 lb (113.399 kg)  10/02/15 250 lb (113.399 kg)    General: Appears her stated age, obese in NAD.  Cardiovascular: Normal rate and rhythm. S1,S2 noted.  No murmur, rubs or gallops noted.  Pulmonary/Chest: Normal effort and positive vesicular breath sounds. No respiratory distress. No wheezes, rales or ronchi noted.  Abdomen: Soft and nontender. Normal bowel  sounds. Rectal: External hemorrhoids noted. Not thrombosed or bleeding.  BMET    Component Value Date/Time   NA 135 10/02/2015 1212   K 3.9 10/02/2015 1212   CL 104 10/02/2015 1212   CO2 24 10/02/2015 1212   GLUCOSE 105* 10/02/2015 1212   BUN 14 10/02/2015 1212   CREATININE 0.71 10/02/2015 1212   CALCIUM 9.1 10/02/2015 1212   GFRNONAA >60 10/02/2015 1212   GFRAA >60 10/02/2015 1212    Lipid Panel  No results found for: CHOL, TRIG, HDL, CHOLHDL, VLDL, LDLCALC  CBC    Component Value Date/Time   WBC 10.8 10/02/2015 1212   RBC 4.97 10/02/2015 1212   HGB 15.1 10/02/2015 1212   HCT 45.3 10/02/2015 1212   PLT 224 10/02/2015 1212   MCV 91.2 10/02/2015 1212   MCH 30.5 10/02/2015 1212   MCHC  33.4 10/02/2015 1212   RDW 14.3 10/02/2015 1212    Hgb A1C No results found for: HGBA1C      Assessment & Plan:   External hemorrhoid:  Drink plenty of fluids Take Colace if needed eRx for Proctofoam- use as directed If persist, or more painful, consider referral to GI for surgical intervention  RTC as needed or if symptoms persist or worsen

## 2015-11-02 NOTE — Patient Instructions (Signed)

## 2015-11-30 ENCOUNTER — Telehealth: Payer: Self-pay | Admitting: Internal Medicine

## 2015-11-30 NOTE — Telephone Encounter (Signed)
Pt called wanting to know if she can have her cholesterol checked for insurance purposes. She has checked her results on MyChart and not seeing what she needs to complete her [employer] form.  CB # [id number]

## 2015-11-30 NOTE — Telephone Encounter (Signed)
She probably needs to make an appt for an annual exam

## 2015-12-01 NOTE — Telephone Encounter (Signed)
Yes and we will get labs at CPE, not before.

## 2015-12-01 NOTE — Telephone Encounter (Signed)
Pt has not had CPE yet---will she need to schedule before you order labs--please advise

## 2015-12-02 NOTE — Telephone Encounter (Signed)
Pt schedule CPE for 01/11/2016

## 2015-12-22 ENCOUNTER — Other Ambulatory Visit: Payer: Self-pay | Admitting: Family Medicine

## 2016-01-05 ENCOUNTER — Encounter: Payer: Self-pay | Admitting: Internal Medicine

## 2016-01-11 ENCOUNTER — Encounter: Payer: Managed Care, Other (non HMO) | Admitting: Internal Medicine

## 2016-02-08 ENCOUNTER — Emergency Department
Admission: EM | Admit: 2016-02-08 | Discharge: 2016-02-08 | Disposition: A | Discharge status: Home / Self Care | Payer: Managed Care, Other (non HMO) | Attending: Emergency Medicine | Admitting: Emergency Medicine

## 2016-02-08 ENCOUNTER — Encounter: Payer: Self-pay | Admitting: Emergency Medicine

## 2016-02-08 DIAGNOSIS — Z79899 Other long term (current) drug therapy: Secondary | ICD-10-CM | POA: Diagnosis not present

## 2016-02-08 DIAGNOSIS — Z87891 Personal history of nicotine dependence: Secondary | ICD-10-CM | POA: Diagnosis not present

## 2016-02-08 DIAGNOSIS — M542 Cervicalgia: Secondary | ICD-10-CM | POA: Diagnosis present

## 2016-02-08 DIAGNOSIS — M5412 Radiculopathy, cervical region: Secondary | ICD-10-CM | POA: Insufficient documentation

## 2016-02-08 DIAGNOSIS — I10 Essential (primary) hypertension: Secondary | ICD-10-CM | POA: Diagnosis not present

## 2016-02-08 MED ORDER — MELOXICAM 15 MG PO TABS: 15.0000 mg | ORAL_TABLET | Freq: Every day | ORAL | Status: DC

## 2016-02-08 NOTE — ED Provider Notes (Signed)
[facility] Emergency Department Provider Note  ____________________________________________  Time seen: Approximately 3:41 PM  I have reviewed the triage vital signs and the nursing notes.   HISTORY  Chief Complaint Neck Pain    HPI Claudia Glenn is a 40 y.o. female who presents to emergency department complaining of right neck, shoulder, and arm pain 2 months. Patient states that she is having neuropathic pain radiating from her neck down her arm. Patient is being followed by orthopedics at this time. She has had epidural steroid injections into the cervical region, narcotics, and physical therapy without relief. Patient states that she is waiting on approval for MRI for further evaluation. Patient states that she takes Motrin daily but has stopped using hydrocodone for pain due to its side effects. Patient denies any change in her symptoms from baseline. She states that symptoms were increased after her last physical therapy session. She denies any paralysis of right arm. She does endorse some numbness and tingling.   Past Medical History  Diagnosis Date  . Hypertension   . Frequent headaches     Patient Active Problem List   Diagnosis Date Noted  . HTN (hypertension) 09/14/2015  . Frequent headaches 09/14/2015    Past Surgical History  Procedure Laterality Date  . Wisdom tooth extraction      Current Outpatient Rx  Name  Route  Sig  Dispense  Refill  . hydrocortisone-pramoxine (PROCTOFOAM-HC) rectal foam   Rectal   Place 1 applicator rectally 2 (two) times daily.   10 g   0   . ibuprofen (ADVIL,MOTRIN) 200 MG tablet   Oral   Take 400 mg by mouth every 6 (six) hours as needed for headache or cramping.         . labetalol (NORMODYNE) 100 MG tablet      TAKE 1 TABLET (100 MG TOTAL) BY MOUTH 2 (TWO) TIMES DAILY.   60 tablet   0     CYCLE FILL MEDICATION. Authorization is required f ...   . meloxicam (MOBIC) 15 MG tablet   Oral    Take 1 tablet (15 mg total) by mouth daily.   30 tablet   0     Allergies Losartan  Family History  Problem Relation Age of Onset  . Irregular heart beat Mother   . Cancer Neg Hx   . Diabetes Neg Hx   . Heart disease Neg Hx   . Stroke Neg Hx     Social History Social History  Substance Use Topics  . Smoking status: Former Smoker -- 0.50 packs/day    Types: Cigarettes  . Smokeless tobacco: Never Used     Comment: quit 2014  . Alcohol Use: 0.0 oz/week    0 Standard drinks or equivalent per week     Comment: rare     Review of Systems  Constitutional: No fever/chills Musculoskeletal: Negative for back pain. Positive for right sided neck, shoulder, and arm pain. Skin: Negative for rash. Neurological: Negative for headaches, focal weakness or numbness. 10-point ROS otherwise negative.  ____________________________________________   PHYSICAL EXAM:  VITAL SIGNS: ED Triage Vitals  Enc Vitals Group     BP 02/08/16 1501 185/105 mmHg     Pulse Rate 02/08/16 1501 72     Resp 02/08/16 1501 16     Temp 02/08/16 1501 98.1 F (36.7 C)     Temp Source 02/08/16 1501 Oral     SpO2 02/08/16 1501 97 %     Weight 02/08/16  1503 250 lb (113.399 kg)     Height 02/08/16 1503 5' 8"  (1.727 m)     Head Cir --      Peak Flow --      Pain Score 02/08/16 1504 8     Pain Loc --      Pain Edu? --      Excl. in [place]? --      Constitutional: Alert and oriented. Well appearing and in no acute distress. Eyes: Conjunctivae are normal. PERRL. EOMI. Head: Atraumatic. Neck: No stridor. No cervical spine tenderness to palpation. Patient is diffusely tender to palpation over the paraspinal muscle groups. No spasms are noted. Cardiovascular: Normal rate, regular rhythm. Normal S1 and S2.  Good peripheral circulation. Respiratory: Normal respiratory effort without tachypnea or retractions. Lungs CTAB. Musculoskeletal: Full range of motion to right shoulder, elbow, and wrist. No tenderness to  palpation. No palpable abnormalities. Radial pulse intact. Sensation intact. Neurologic:  Normal speech and language. No gross focal neurologic deficits are appreciated.  Skin:  Skin is warm, dry and intact. No rash noted. Psychiatric: Mood and affect are normal. Speech and behavior are normal. Patient exhibits appropriate insight and judgement.   ____________________________________________   LABS (all labs ordered are listed, but only abnormal results are displayed)  Labs Reviewed - No data to display ____________________________________________  EKG   ____________________________________________  RADIOLOGY   No results found.  ____________________________________________    PROCEDURES  Procedure(s) performed:       Medications - No data to display   ____________________________________________   INITIAL IMPRESSION / ASSESSMENT AND PLAN / ED COURSE  Pertinent labs & imaging results that were available during my care of the patient were reviewed by me and considered in my medical decision making (see chart for details).  Patient's diagnosis is consistent with cervical radiculopathy. Patient is being followed by orthopedics at this time. Patient states that she takes Motrin on a daily basis but has not been placed on other anti-inflammatories. Patient has not had any recent injury or recent signs and changes from baseline. Patient was requesting MRI if possible, however this is not undertaken at this time. Patient is to have an MRI in 1 month of both neck and shoulder. Patient will be prescribed meloxicam for anti-inflammatory properties and is instructed to stop other NSAIDs. Patient will follow-up with her scheduled PT appointments and orthopedic visit.. . Patient is given ED precautions to return to the ED for any worsening or new symptoms.     ____________________________________________  FINAL CLINICAL IMPRESSION(S) / ED DIAGNOSES  Final diagnoses:   Cervical radiculopathy      NEW MEDICATIONS STARTED DURING THIS VISIT:  New Prescriptions   MELOXICAM (MOBIC) 15 MG TABLET    Take 1 tablet (15 mg total) by mouth daily.        Charline Bills Ceili Boshers, PA-C 02/08/16 1606  Orbie Pyo, MD 02/08/16 [phone]

## 2016-02-08 NOTE — ED Notes (Signed)
C/O neck, right shoulder, right arm pain x 2 months.

## 2016-02-08 NOTE — ED Notes (Signed)
Is under care of Ortho.  Has had injection, narcotics, and is currently in [place].  Dr. Alfonso Ramus is patient's Orthopedic doctory.

## 2016-02-08 NOTE — Discharge Instructions (Signed)
Cervical Radiculopathy Cervical radiculopathy happens when a nerve in the neck (cervical nerve) is pinched or bruised. This condition can develop because of an injury or as part of the normal aging process. Pressure on the cervical nerves can cause pain or numbness that runs from the neck all the way down into the arm and fingers. Usually, this condition gets better with rest. Treatment may be needed if the condition does not improve.  CAUSES This condition may be caused by:  Injury.  Slipped (herniated) disk.  Muscle tightness in the neck because of overuse.  Arthritis.  Breakdown or degeneration in the bones and joints of the spine (spondylosis) due to aging.  Bone spurs that may develop near the cervical nerves. SYMPTOMS Symptoms of this condition include:  Pain that runs from the neck to the arm and hand. The pain can be severe or irritating. It may be worse when the neck is moved.  Numbness or weakness in the affected arm and hand. DIAGNOSIS This condition may be diagnosed based on symptoms, medical history, and a physical exam. You may also have tests, including:  X-rays.  CT scan.  MRI.  Electromyogram (EMG).  Nerve conduction tests. TREATMENT In many cases, treatment is not needed for this condition. With rest, the condition usually gets better over time. If treatment is needed, options may include:  Wearing a soft neck collar for short periods of time.  Physical therapy to strengthen your neck muscles.  Medicines, such as NSAIDs, oral corticosteroids, or spinal injections.  Surgery. This may be needed if other treatments do not help. Various types of surgery may be done depending on the cause of your problems. HOME CARE INSTRUCTIONS Managing Pain  Take over-the-counter and prescription medicines only as told by your health care provider.  If directed, apply ice to the affected area.  Put ice in a plastic bag.  Place a towel between your skin and the  bag.  Leave the ice on for 20 minutes, 2-3 times per day.  If ice does not help, you can try using heat. Take a warm shower or warm bath, or use a heat pack as told by your health care provider.  Try a gentle neck and shoulder massage to help relieve symptoms. Activity  Rest as needed. Follow instructions from your health care provider about any restrictions on activities.  Do stretching and strengthening exercises as told by your health care provider or physical therapist. General Instructions  If you were given a soft collar, wear it as told by your health care provider.  Use a flat pillow when you sleep.  Keep all follow-up visits as told by your health care provider. This is important. SEEK MEDICAL CARE IF:  Your condition does not improve with treatment. SEEK IMMEDIATE MEDICAL CARE IF:  Your pain gets much worse and cannot be controlled with medicines.  You have weakness or numbness in your hand, arm, face, or leg.  You have a high fever.  You have a stiff, rigid neck.  You lose control of your bowels or your bladder (have incontinence).  You have trouble with walking, balance, or speaking.   This information is not intended to replace advice given to you by your health care provider. Make sure you discuss any questions you have with your health care provider.   Document Released: 08/22/2001 Document Revised: 08/18/2015 Document Reviewed: 01/21/2015 Elsevier Interactive Patient Education [employer].

## 2016-02-09 ENCOUNTER — Telehealth: Payer: Self-pay

## 2016-02-09 NOTE — Telephone Encounter (Signed)
Pt left v/m requesting refill labetalol to [facility]; spoke with Colletta Maryland at [facility] and pt already has available refills and will get rx ready for pick up. Left detailed v/m for pt to ck with pharmacy.

## 2016-03-14 ENCOUNTER — Other Ambulatory Visit: Payer: Self-pay | Admitting: Sports Medicine

## 2016-03-14 DIAGNOSIS — M542 Cervicalgia: Secondary | ICD-10-CM

## 2016-03-15 ENCOUNTER — Encounter: Payer: Managed Care, Other (non HMO) | Admitting: Internal Medicine

## 2016-03-22 ENCOUNTER — Ambulatory Visit
Admission: RE | Admit: 2016-03-22 | Discharge: 2016-03-22 | Disposition: A | Discharge status: Home / Self Care | Payer: Managed Care, Other (non HMO) | Source: Ambulatory Visit | Attending: Sports Medicine | Admitting: Sports Medicine

## 2016-03-22 DIAGNOSIS — M542 Cervicalgia: Secondary | ICD-10-CM

## 2016-05-05 ENCOUNTER — Ambulatory Visit (INDEPENDENT_AMBULATORY_CARE_PROVIDER_SITE_OTHER): Payer: Managed Care, Other (non HMO) | Admitting: Family Medicine

## 2016-05-05 ENCOUNTER — Ambulatory Visit (INDEPENDENT_AMBULATORY_CARE_PROVIDER_SITE_OTHER)
Admission: RE | Admit: 2016-05-05 | Discharge: 2016-05-05 | Disposition: A | Discharge status: Home / Self Care | Payer: Managed Care, Other (non HMO) | Source: Ambulatory Visit | Attending: Family Medicine | Admitting: Family Medicine

## 2016-05-05 ENCOUNTER — Telehealth: Payer: Self-pay | Admitting: Internal Medicine

## 2016-05-05 ENCOUNTER — Encounter: Payer: Self-pay | Admitting: Family Medicine

## 2016-05-05 VITALS — BP 146/102 | HR 65 | Temp 98.3°F | Wt 244.5 lb

## 2016-05-05 DIAGNOSIS — M7918 Myalgia, other site: Secondary | ICD-10-CM

## 2016-05-05 DIAGNOSIS — M791 Myalgia: Secondary | ICD-10-CM

## 2016-05-05 DIAGNOSIS — W108XXA Fall (on) (from) other stairs and steps, initial encounter: Secondary | ICD-10-CM | POA: Diagnosis not present

## 2016-05-05 DIAGNOSIS — M549 Dorsalgia, unspecified: Secondary | ICD-10-CM

## 2016-05-05 DIAGNOSIS — M546 Pain in thoracic spine: Secondary | ICD-10-CM

## 2016-05-05 MED ORDER — DICLOFENAC SODIUM 75 MG PO TBEC: 75.0000 mg | DELAYED_RELEASE_TABLET | Freq: Two times a day (BID) | ORAL | Status: DC

## 2016-05-05 MED ORDER — TRAMADOL HCL 50 MG PO TABS: 50.0000 mg | ORAL_TABLET | Freq: Three times a day (TID) | ORAL | Status: DC | PRN

## 2016-05-05 NOTE — Progress Notes (Signed)
Pre visit review using our clinic review tool, if applicable. No additional management support is needed unless otherwise documented below in the visit note. 

## 2016-05-05 NOTE — Progress Notes (Signed)
   Subjective:    Patient ID: Claudia Glenn, female    DOB: 1975/12/16, 40 y.o.   MRN: [id number]  HPI  40 year old female pt of [facility] presents following fall on 5/25.  She reports accidental fall after tripping down stairs. Landed on left lower buttock on last step  Very sore to sit, contusion at place.  Lower central back very sore. No issues   No numbness in groin. No new numbness, no tingling  Scrapes on elbows.  She has used ibuprofen 400 mg at bedtime and in AM today.  Helps a little.  Social History /Family History/Past Medical History reviewed and updated if needed. No history of back issues other than pinched nerve in neck. No hx of osteoporosis.   Review of Systems  Constitutional: Negative for fever and fatigue.  HENT: Negative for ear pain.   Eyes: Negative for pain.  Respiratory: Negative for chest tightness and shortness of breath.   Cardiovascular: Negative for chest pain, palpitations and leg swelling.  Gastrointestinal: Negative for abdominal pain.  Genitourinary: Negative for dysuria.       Objective:   Physical Exam  Constitutional: Vital signs are normal. She appears well-developed and well-nourished. She is cooperative.  Non-toxic appearance. She does not appear ill. No distress.  HENT:  Head: Normocephalic.  Right Ear: Hearing, tympanic membrane, external ear and ear canal normal. Tympanic membrane is not erythematous, not retracted and not bulging.  Left Ear: Hearing, tympanic membrane, external ear and ear canal normal. Tympanic membrane is not erythematous, not retracted and not bulging.  Nose: No mucosal edema or rhinorrhea. Right sinus exhibits no maxillary sinus tenderness and no frontal sinus tenderness. Left sinus exhibits no maxillary sinus tenderness and no frontal sinus tenderness.  Mouth/Throat: Uvula is midline, oropharynx is clear and moist and mucous membranes are normal.  Eyes: Conjunctivae, EOM and lids are normal. Pupils are  equal, round, and reactive to light. Lids are everted and swept, no foreign bodies found.  Neck: Trachea normal and normal range of motion. Neck supple. Carotid bruit is not present. No thyroid mass and no thyromegaly present.  Cardiovascular: Normal rate, regular rhythm, S1 normal, S2 normal, normal heart sounds, intact distal pulses and normal pulses.  Exam reveals no gallop and no friction rub.   No murmur heard. Pulmonary/Chest: Effort normal and breath sounds normal. No tachypnea. No respiratory distress. She has no decreased breath sounds. She has no wheezes. She has no rhonchi. She has no rales.  Abdominal: Soft. Normal appearance and bowel sounds are normal. There is no tenderness.  Musculoskeletal:       Right hip: Normal. She exhibits normal range of motion, normal strength and no tenderness.       Left hip: She exhibits normal range of motion, normal strength and no tenderness.       Thoracic back: She exhibits decreased range of motion, tenderness and bony tenderness.       Lumbar back: Normal. She exhibits normal range of motion and no tenderness.  No coccyx pain, pain over ischial spine  Neurological: She is alert. No sensory deficit. She exhibits normal muscle tone.  Skin: Skin is warm, dry and intact. No rash noted.  Psychiatric: Her speech is normal and behavior is normal. Judgment and thought content normal. Her mood appears not anxious. Cognition and memory are normal. She does not exhibit a depressed mood.          Assessment & Plan:

## 2016-05-05 NOTE — Patient Instructions (Signed)
Ice . Avoid pressure on buttock.  We will call with X-ray results. Diclofenac twice daily for apin and inflammation.  Tramadol for breakthrough pain if needed.  Expect to take 2 weeks for resolution, call if not improving as expected.

## 2016-05-05 NOTE — Telephone Encounter (Signed)
Patient Name: Claudia Glenn DOB: 29-Aug-1976 Initial Comment Caller states she fell yesterday on the stairs on her back. Bottom of her back. Left buttcheeks are very painful. Nurse Assessment Nurse: Vallery Sa, RN, Cathy Date/Time (Eastern Time): 05/05/2016 10:56:58 AM Confirm and document reason for call. If symptomatic, describe symptoms. You must click the next button to save text entered. ---Caller states she fell on steps and injured her low back yesterday and buttocks yesterday (rated as a 5/6 on the 1 to 10 scale). No impact to her head. No fever. Alert and responsive. Has the patient traveled out of the country within the last 30 days? ---No Does the patient have any new or worsening symptoms? ---Yes Will a triage be completed? ---Yes Related visit to physician within the last 2 weeks? ---No Does the PT have any chronic conditions? (i.e. diabetes, asthma, etc.) ---Yes List chronic conditions. ---High Blood Pressure, Shoulder problems Is the patient pregnant or possibly pregnant? (Ask all females between the ages of 4-55) ---No Is this a behavioral health or substance abuse call? ---No Guidelines Guideline Title Affirmed Question Affirmed Notes Back Injury [1] Landed hard on feet or buttocks AND [2] pain over spine Final Disposition User See Physician within 4 Hours (or PCP triage) Vallery Sa, RN, Cathy Comments Scheduled for for 3pm appointment with Eliezer Lofts today. Referrals REFERRED TO PCP OFFICE Disagree/Comply: Comply

## 2016-05-05 NOTE — Telephone Encounter (Signed)
Pt has appt 05/05/16 at 3 PM with Dr Diona Browner.

## 2016-05-23 ENCOUNTER — Ambulatory Visit (INDEPENDENT_AMBULATORY_CARE_PROVIDER_SITE_OTHER): Payer: Managed Care, Other (non HMO) | Admitting: Internal Medicine

## 2016-05-23 ENCOUNTER — Encounter: Payer: Self-pay | Admitting: Internal Medicine

## 2016-05-23 VITALS — BP 124/82 | HR 77 | Temp 99.0°F | Ht 67.0 in | Wt 245.0 lb

## 2016-05-23 DIAGNOSIS — L989 Disorder of the skin and subcutaneous tissue, unspecified: Secondary | ICD-10-CM

## 2016-05-23 DIAGNOSIS — Z0001 Encounter for general adult medical examination with abnormal findings: Secondary | ICD-10-CM

## 2016-05-23 DIAGNOSIS — R6889 Other general symptoms and signs: Secondary | ICD-10-CM

## 2016-05-23 DIAGNOSIS — M25522 Pain in left elbow: Secondary | ICD-10-CM

## 2016-05-23 DIAGNOSIS — Z114 Encounter for screening for human immunodeficiency virus [HIV]: Secondary | ICD-10-CM

## 2016-05-23 NOTE — Patient Instructions (Signed)
Health Maintenance, Female Adopting a healthy lifestyle and getting preventive care can go a long way to promote health and wellness. Talk with your health care provider about what schedule of regular examinations is right for you. This is a good chance for you to check in with your provider about disease prevention and staying healthy. In between checkups, there are plenty of things you can do on your own. Experts have done a lot of research about which lifestyle changes and preventive measures are most likely to keep you healthy. Ask your health care provider for more information. WEIGHT AND DIET  Eat a healthy diet  Be sure to include plenty of vegetables, fruits, low-fat dairy products, and lean protein.  Do not eat a lot of foods high in solid fats, added sugars, or salt.  Get regular exercise. This is one of the most important things you can do for your health.  Most adults should exercise for at least 150 minutes each week. The exercise should increase your heart rate and make you sweat (moderate-intensity exercise).  Most adults should also do strengthening exercises at least twice a week. This is in addition to the moderate-intensity exercise.  Maintain a healthy weight  Body mass index (BMI) is a measurement that can be used to identify possible weight problems. It estimates body fat based on height and weight. Your health care provider can help determine your BMI and help you achieve or maintain a healthy weight.  For females 20 years of age and older:   A BMI below 18.5 is considered underweight.  A BMI of 18.5 to 24.9 is normal.  A BMI of 25 to 29.9 is considered overweight.  A BMI of 30 and above is considered obese.  Watch levels of cholesterol and blood lipids  You should start having your blood tested for lipids and cholesterol at 40 years of age, then have this test every 5 years.  You may need to have your cholesterol levels checked more often if:  Your lipid  or cholesterol levels are high.  You are older than 40 years of age.  You are at high risk for heart disease.  CANCER SCREENING   Lung Cancer  Lung cancer screening is recommended for adults 55-80 years old who are at high risk for lung cancer because of a history of smoking.  A yearly low-dose CT scan of the lungs is recommended for people who:  Currently smoke.  Have quit within the past 15 years.  Have at least a 30-pack-year history of smoking. A pack year is smoking an average of one pack of cigarettes a day for 1 year.  Yearly screening should continue until it has been 15 years since you quit.  Yearly screening should stop if you develop a health problem that would prevent you from having lung cancer treatment.  Breast Cancer  Practice breast self-awareness. This means understanding how your breasts normally appear and feel.  It also means doing regular breast self-exams. Let your health care provider know about any changes, no matter how small.  If you are in your 20s or 30s, you should have a clinical breast exam (CBE) by a health care provider every 1-3 years as part of a regular health exam.  If you are 40 or older, have a CBE every year. Also consider having a breast X-ray (mammogram) every year.  If you have a family history of breast cancer, talk to your health care provider about genetic screening.  If you   are at high risk for breast cancer, talk to your health care provider about having an MRI and a mammogram every year.  Breast cancer gene (BRCA) assessment is recommended for women who have family members with BRCA-related cancers. BRCA-related cancers include:  Breast.  Ovarian.  Tubal.  Peritoneal cancers.  Results of the assessment will determine the need for genetic counseling and BRCA1 and BRCA2 testing. Cervical Cancer Your health care provider may recommend that you be screened regularly for cancer of the pelvic organs (ovaries, uterus, and  vagina). This screening involves a pelvic examination, including checking for microscopic changes to the surface of your cervix (Pap test). You may be encouraged to have this screening done every 3 years, beginning at age 21.  For women ages 30-65, health care providers may recommend pelvic exams and Pap testing every 3 years, or they may recommend the Pap and pelvic exam, combined with testing for human papilloma virus (HPV), every 5 years. Some types of HPV increase your risk of cervical cancer. Testing for HPV may also be done on women of any age with unclear Pap test results.  Other health care providers may not recommend any screening for nonpregnant women who are considered low risk for pelvic cancer and who do not have symptoms. Ask your health care provider if a screening pelvic exam is right for you.  If you have had past treatment for cervical cancer or a condition that could lead to cancer, you need Pap tests and screening for cancer for at least 20 years after your treatment. If Pap tests have been discontinued, your risk factors (such as having a new sexual partner) need to be reassessed to determine if screening should resume. Some women have medical problems that increase the chance of getting cervical cancer. In these cases, your health care provider may recommend more frequent screening and Pap tests. Colorectal Cancer  This type of cancer can be detected and often prevented.  Routine colorectal cancer screening usually begins at 40 years of age and continues through 40 years of age.  Your health care provider may recommend screening at an earlier age if you have risk factors for colon cancer.  Your health care provider may also recommend using home test kits to check for hidden blood in the stool.  A small camera at the end of a tube can be used to examine your colon directly (sigmoidoscopy or colonoscopy). This is done to check for the earliest forms of colorectal  cancer.  Routine screening usually begins at age 50.  Direct examination of the colon should be repeated every 5-10 years through 40 years of age. However, you may need to be screened more often if early forms of precancerous polyps or small growths are found. Skin Cancer  Check your skin from head to toe regularly.  Tell your health care provider about any new moles or changes in moles, especially if there is a change in a mole's shape or color.  Also tell your health care provider if you have a mole that is larger than the size of a pencil eraser.  Always use sunscreen. Apply sunscreen liberally and repeatedly throughout the day.  Protect yourself by wearing long sleeves, pants, a wide-brimmed hat, and sunglasses whenever you are outside. HEART DISEASE, DIABETES, AND HIGH BLOOD PRESSURE   High blood pressure causes heart disease and increases the risk of stroke. High blood pressure is more likely to develop in:  People who have blood pressure in the high end   of the normal range (130-139/85-89 mm Hg).  People who are overweight or obese.  People who are African American.  If you are 38-23 years of age, have your blood pressure checked every 3-5 years. If you are 61 years of age or older, have your blood pressure checked every year. You should have your blood pressure measured twice--once when you are at a hospital or clinic, and once when you are not at a hospital or clinic. Record the average of the two measurements. To check your blood pressure when you are not at a hospital or clinic, you can use:  An automated blood pressure machine at a pharmacy.  A home blood pressure monitor.  If you are between 45 years and 39 years old, ask your health care provider if you should take aspirin to prevent strokes.  Have regular diabetes screenings. This involves taking a blood sample to check your fasting blood sugar level.  If you are at a normal weight and have a low risk for diabetes,  have this test once every three years after 40 years of age.  If you are overweight and have a high risk for diabetes, consider being tested at a younger age or more often. PREVENTING INFECTION  Hepatitis B  If you have a higher risk for hepatitis B, you should be screened for this virus. You are considered at high risk for hepatitis B if:  You were born in a country where hepatitis B is common. Ask your health care provider which countries are considered high risk.  Your parents were born in a high-risk country, and you have not been immunized against hepatitis B (hepatitis B vaccine).  You have HIV or AIDS.  You use needles to inject street drugs.  You live with someone who has hepatitis B.  You have had sex with someone who has hepatitis B.  You get hemodialysis treatment.  You take certain medicines for conditions, including cancer, organ transplantation, and autoimmune conditions. Hepatitis C  Blood testing is recommended for:  Everyone born from [birth year] through 1965.  Anyone with known risk factors for hepatitis C. Sexually transmitted infections (STIs)  You should be screened for sexually transmitted infections (STIs) including gonorrhea and chlamydia if:  You are sexually active and are younger than 40 years of age.  You are older than 40 years of age and your health care provider tells you that you are at risk for this type of infection.  Your sexual activity has changed since you were last screened and you are at an increased risk for chlamydia or gonorrhea. Ask your health care provider if you are at risk.  If you do not have HIV, but are at risk, it may be recommended that you take a prescription medicine daily to prevent HIV infection. This is called pre-exposure prophylaxis (PrEP). You are considered at risk if:  You are sexually active and do not regularly use condoms or know the HIV status of your partner(s).  You take drugs by injection.  You are sexually  active with a partner who has HIV. Talk with your health care provider about whether you are at high risk of being infected with HIV. If you choose to begin PrEP, you should first be tested for HIV. You should then be tested every 3 months for as long as you are taking PrEP.  PREGNANCY   If you are premenopausal and you may become pregnant, ask your health care provider about preconception counseling.  If you may  become pregnant, take 400 to 800 micrograms (mcg) of folic acid every day.  If you want to prevent pregnancy, talk to your health care provider about birth control (contraception). OSTEOPOROSIS AND MENOPAUSE   Osteoporosis is a disease in which the bones lose minerals and strength with aging. This can result in serious bone fractures. Your risk for osteoporosis can be identified using a bone density scan.  If you are 61 years of age or older, or if you are at risk for osteoporosis and fractures, ask your health care provider if you should be screened.  Ask your health care provider whether you should take a calcium or vitamin D supplement to lower your risk for osteoporosis.  Menopause may have certain physical symptoms and risks.  Hormone replacement therapy may reduce some of these symptoms and risks. Talk to your health care provider about whether hormone replacement therapy is right for you.  HOME CARE INSTRUCTIONS   Schedule regular health, dental, and eye exams.  Stay current with your immunizations.   Do not use any tobacco products including cigarettes, chewing tobacco, or electronic cigarettes.  If you are pregnant, do not drink alcohol.  If you are breastfeeding, limit how much and how often you drink alcohol.  Limit alcohol intake to no more than 1 drink per day for nonpregnant women. One drink equals 12 ounces of beer, 5 ounces of wine, or 1 ounces of hard liquor.  Do not use street drugs.  Do not share needles.  Ask your health care provider for help if  you need support or information about quitting drugs.  Tell your health care provider if you often feel depressed.  Tell your health care provider if you have ever been abused or do not feel safe at home.   This information is not intended to replace advice given to you by your health care provider. Make sure you discuss any questions you have with your health care provider.   Document Released: 06/12/2011 Document Revised: 12/18/2014 Document Reviewed: 10/29/2013 Elsevier Interactive Patient Education [employer].

## 2016-05-23 NOTE — Progress Notes (Signed)
Subjective:    Patient ID: Claudia Glenn, female    DOB: [birth year], 40 y.o.   MRN: [id number]  HPI  Pt presents to the clinic today for her annual exam.  Flu: never Tetanus: 2011 Pap Smear: 09/2015 Dentist: annually  Diet: She does consume some meat. She eats fruits and veggies a few times per week. She tries to avoid fried foods. She drinks mostly Mt. Dew and water. Exercise: She walks for 30 minutes 3-4 times per week.  She does c/o left elbow pain. She reports she had a fall 2 weeks ago. She describes the pain as soreness. She has not noticed any bruising or swelling. She has taken Advil with some relief. She did not ice it.    Review of Systems      Past Medical History  Diagnosis Date  . Hypertension   . Frequent headaches     Current Outpatient Prescriptions  Medication Sig Dispense Refill  . diclofenac (VOLTAREN) 75 MG EC tablet Take 1 tablet (75 mg total) by mouth 2 (two) times daily. 30 tablet 0  . labetalol (NORMODYNE) 100 MG tablet TAKE 1 TABLET (100 MG TOTAL) BY MOUTH 2 (TWO) TIMES DAILY. 60 tablet 0  . traMADol (ULTRAM) 50 MG tablet Take 1 tablet (50 mg total) by mouth every 8 (eight) hours as needed for moderate pain or severe pain. 15 tablet 0   No current facility-administered medications for this visit.    Allergies  Allergen Reactions  . Losartan Other (See Comments)    Chest tightness    Family History  Problem Relation Age of Onset  . Irregular heart beat Mother   . Cancer Neg Hx   . Diabetes Neg Hx   . Heart disease Neg Hx   . Stroke Neg Hx     Social History   Social History  . Marital Status: Married    Spouse Name: N/A  . Number of Children: N/A  . Years of Education: N/A   Occupational History  . Not on file.   Social History Main Topics  . Smoking status: Former Smoker -- 0.50 packs/day    Types: Cigarettes  . Smokeless tobacco: Never Used     Comment: quit 2014  . Alcohol Use: 0.0 oz/week    0 Standard drinks or  equivalent per week     Comment: rare  . Drug Use: No  . Sexual Activity: Yes    Birth Control/ Protection: None   Other Topics Concern  . Not on file   Social History Narrative     Constitutional: Denies fever, malaise, fatigue, headache or abrupt weight changes.  HEENT: Denies eye pain, eye redness, ear pain, ringing in the ears, wax buildup, runny nose, nasal congestion, bloody nose, or sore throat. Respiratory: Denies difficulty breathing, shortness of breath, cough or sputum production.   Cardiovascular: Denies chest pain, chest tightness, palpitations or swelling in the hands or feet.  Gastrointestinal: Denies abdominal pain, bloating, constipation, diarrhea or blood in the stool.  GU: Denies urgency, frequency, pain with urination, burning sensation, blood in urine, odor or discharge. Musculoskeletal: Pt reports left elbow pain. Denies decrease in range of motion, difficulty with gait, muscle pain or joint swelling.  Skin: Denies redness, rashes, lesions or ulcercations.  Neurological: Denies dizziness, difficulty with memory, difficulty with speech or problems with balance and coordination.  Psych: Denies anxiety, depression, SI/HI.  No other specific complaints in a complete review of systems (except as listed in HPI above).  Objective:  Physical Exam   BP 124/82 mmHg  Pulse 77  Temp(Src) 99 F (37.2 C) (Oral)  Ht 5' 7"  (1.702 m)  Wt 245 lb (111.131 kg)  BMI 38.36 kg/m2  SpO2 98%  LMP 04/29/2016 Wt Readings from Last 3 Encounters:  05/23/16 245 lb (111.131 kg)  05/05/16 244 lb 8 oz (110.904 kg)  02/08/16 250 lb (113.399 kg)    General: Appears her stated age, obese in NAD. Skin: Warm, dry and intact. Scaly oblong, 1 cm lesion noted on left side of face. HEENT: Head: normal shape and size; Eyes: sclera white, no icterus, conjunctiva pink, PERRLA and EOMs intact; Ears: Tm's gray and intact, normal light reflex;Throat/Mouth: Teeth present, mucosa pink and moist,  no exudate, lesions or ulcerations noted.  Neck:  Neck supple, trachea midline. No masses, lumps or thyromegaly present.  Cardiovascular: Normal rate and rhythm. S1,S2 noted.  No murmur, rubs or gallops noted. No JVD or BLE edema. No carotid bruits noted. Pulmonary/Chest: Normal effort and positive vesicular breath sounds. No respiratory distress. No wheezes, rales or ronchi noted.  Abdomen: Soft and nontender. Normal bowel sounds. No distention or masses noted. Liver, spleen and kidneys non palpable. Musculoskeletal: Normal flexion and extension of the left elbow. Pain with palpation of the olecranon. Mild swelling noted. No difficulty with gait.  Neurological: Alert and oriented. Cranial nerves II-XII grossly  intact. Coordination normal.  Psychiatric: Mood and affect normal. Behavior is normal. Judgment and thought content normal.     BMET    Component Value Date/Time   NA 135 10/02/2015 1212   K 3.9 10/02/2015 1212   CL 104 10/02/2015 1212   CO2 24 10/02/2015 1212   GLUCOSE 105* 10/02/2015 1212   BUN 14 10/02/2015 1212   CREATININE 0.71 10/02/2015 1212   CALCIUM 9.1 10/02/2015 1212   GFRNONAA >60 10/02/2015 1212   GFRAA >60 10/02/2015 1212    Lipid Panel  No results found for: CHOL, TRIG, HDL, CHOLHDL, VLDL, LDLCALC  CBC    Component Value Date/Time   WBC 10.8 10/02/2015 1212   RBC 4.97 10/02/2015 1212   HGB 15.1 10/02/2015 1212   HCT 45.3 10/02/2015 1212   PLT 224 10/02/2015 1212   MCV 91.2 10/02/2015 1212   MCH 30.5 10/02/2015 1212   MCHC 33.4 10/02/2015 1212   RDW 14.3 10/02/2015 1212    Hgb A1C No results found for: HGBA1C      Assessment & Plan:   Preventative Health Maintenance:  Encouraged her to get a flu shot in the fall Tetanus UTD Pap Smear UTD Discussed mammogram screening at age 72 Encouraged her to consume a balanced diet and start an exercise regimen Encouraged her to see a dentist at least annually Discussed vision screening starting at  age 34 Advised her to see a dermatologist about the skin lesion on her face Will check CBC, CMET, Lipid, A1C and HIV today  Left elbow pain:  Likely bursitis Take Ibuprofen and try icing it  RTC in 1 year, sooner if needed

## 2016-05-23 NOTE — Progress Notes (Signed)
Pre visit review using our clinic review tool, if applicable. No additional management support is needed unless otherwise documented below in the visit note. 

## 2016-05-23 NOTE — Addendum Note (Signed)
Addended by: Ellamae Sia on: 05/23/2016 03:09 PM   Modules accepted: Miquel Dunn

## 2016-05-24 LAB — CBC
HCT: 41.8 % (ref 36.0–46.0)
Hemoglobin: 14 g/dL (ref 12.0–15.0)
MCHC: 33.4 g/dL (ref 30.0–36.0)
MCV: 93.1 fl (ref 78.0–100.0)
PLATELETS: 231 10*3/uL (ref 150.0–400.0)
RBC: 4.49 Mil/uL (ref 3.87–5.11)
RDW: 14.1 % (ref 11.5–15.5)
WBC: 12.9 10*3/uL — ABNORMAL HIGH (ref 4.0–10.5)

## 2016-05-24 LAB — HEMOGLOBIN A1C: HEMOGLOBIN A1C: 5.5 % (ref 4.6–6.5)

## 2016-05-24 LAB — COMPREHENSIVE METABOLIC PANEL
ALT: 13 U/L (ref 0–35)
AST: 14 U/L (ref 0–37)
Albumin: 4.4 g/dL (ref 3.5–5.2)
Alkaline Phosphatase: 82 U/L (ref 39–117)
BUN: 19 mg/dL (ref 6–23)
CALCIUM: 9.2 mg/dL (ref 8.4–10.5)
CHLORIDE: 104 meq/L (ref 96–112)
CO2: 26 meq/L (ref 19–32)
CREATININE: 0.79 mg/dL (ref 0.40–1.20)
GFR: 85.87 mL/min (ref >60.00)
Glucose, Bld: 84 mg/dL (ref 70–99)
POTASSIUM: 4.4 meq/L (ref 3.5–5.1)
Sodium: 137 mEq/L (ref 135–145)
Total Bilirubin: 0.5 mg/dL (ref 0.2–1.2)
Total Protein: 7 g/dL (ref 6.0–8.3)

## 2016-05-24 LAB — LIPID PANEL
CHOL/HDL RATIO: 5
Cholesterol: 212 mg/dL — ABNORMAL HIGH (ref 0–200)
HDL: 45.5 mg/dL (ref >39.00)
LDL CALC: 145 mg/dL — AB (ref 0–99)
NONHDL: 166.88
TRIGLYCERIDES: 107 mg/dL (ref 0.0–149.0)
VLDL: 21.4 mg/dL (ref 0.0–40.0)

## 2016-05-24 LAB — HIV ANTIBODY (ROUTINE TESTING W REFLEX): HIV: NONREACTIVE

## 2016-06-17 ENCOUNTER — Other Ambulatory Visit: Payer: Self-pay | Admitting: Internal Medicine

## 2016-06-19 ENCOUNTER — Other Ambulatory Visit: Payer: Self-pay | Admitting: Internal Medicine

## 2016-06-19 NOTE — Telephone Encounter (Signed)
Last filled 12/2015---please advise if patient is to continue medication

## 2016-06-19 NOTE — Telephone Encounter (Signed)
Yes should continue Labetalol.

## 2016-08-19 ENCOUNTER — Other Ambulatory Visit: Payer: Self-pay | Admitting: Internal Medicine

## 2017-05-15 IMAGING — DX DG THORACIC SPINE 3V
3 series · 4 of 4 positions shown · non-contrast
Comparison: None.

CLINICAL DATA: Acute thoracic pain.  Accidental fall on steps.

EXAM:
THORACIC SPINE - 3 VIEWS

[Series 1: t-spine ap · 0.14mm/px · 2 of 2 slices shown]
[im 1/2]
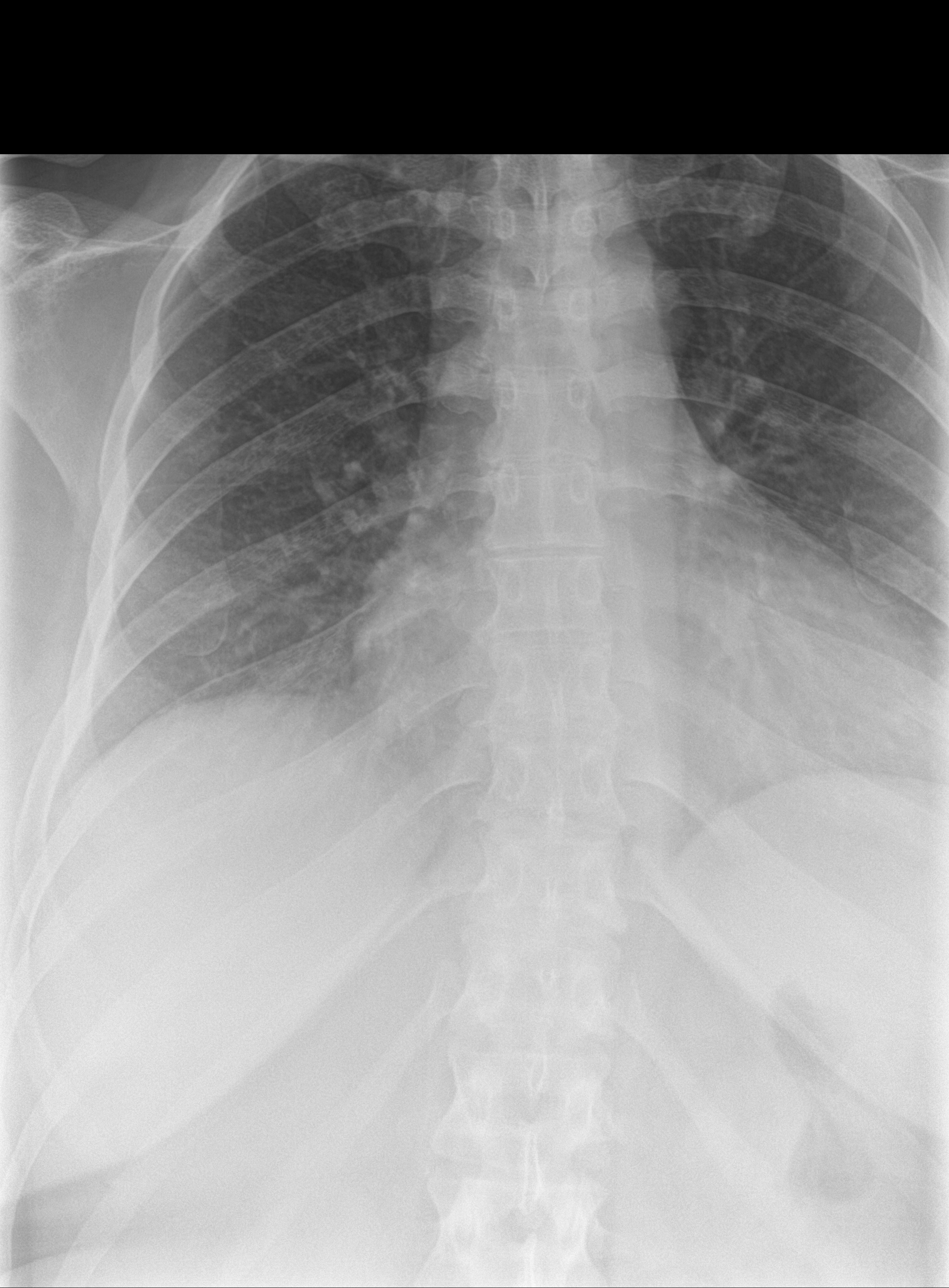
[im 2/2]
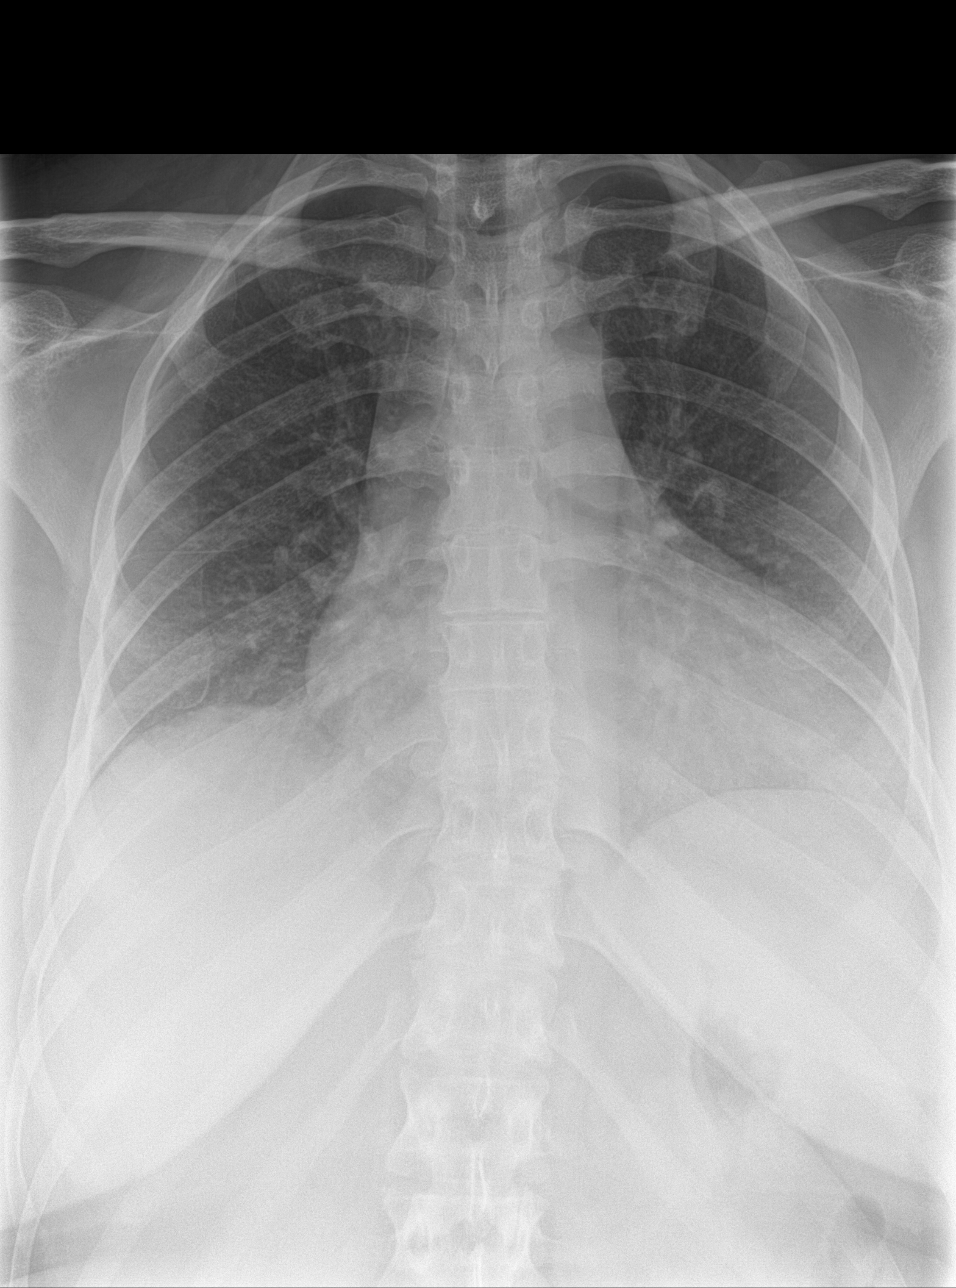

[t-spine lat]
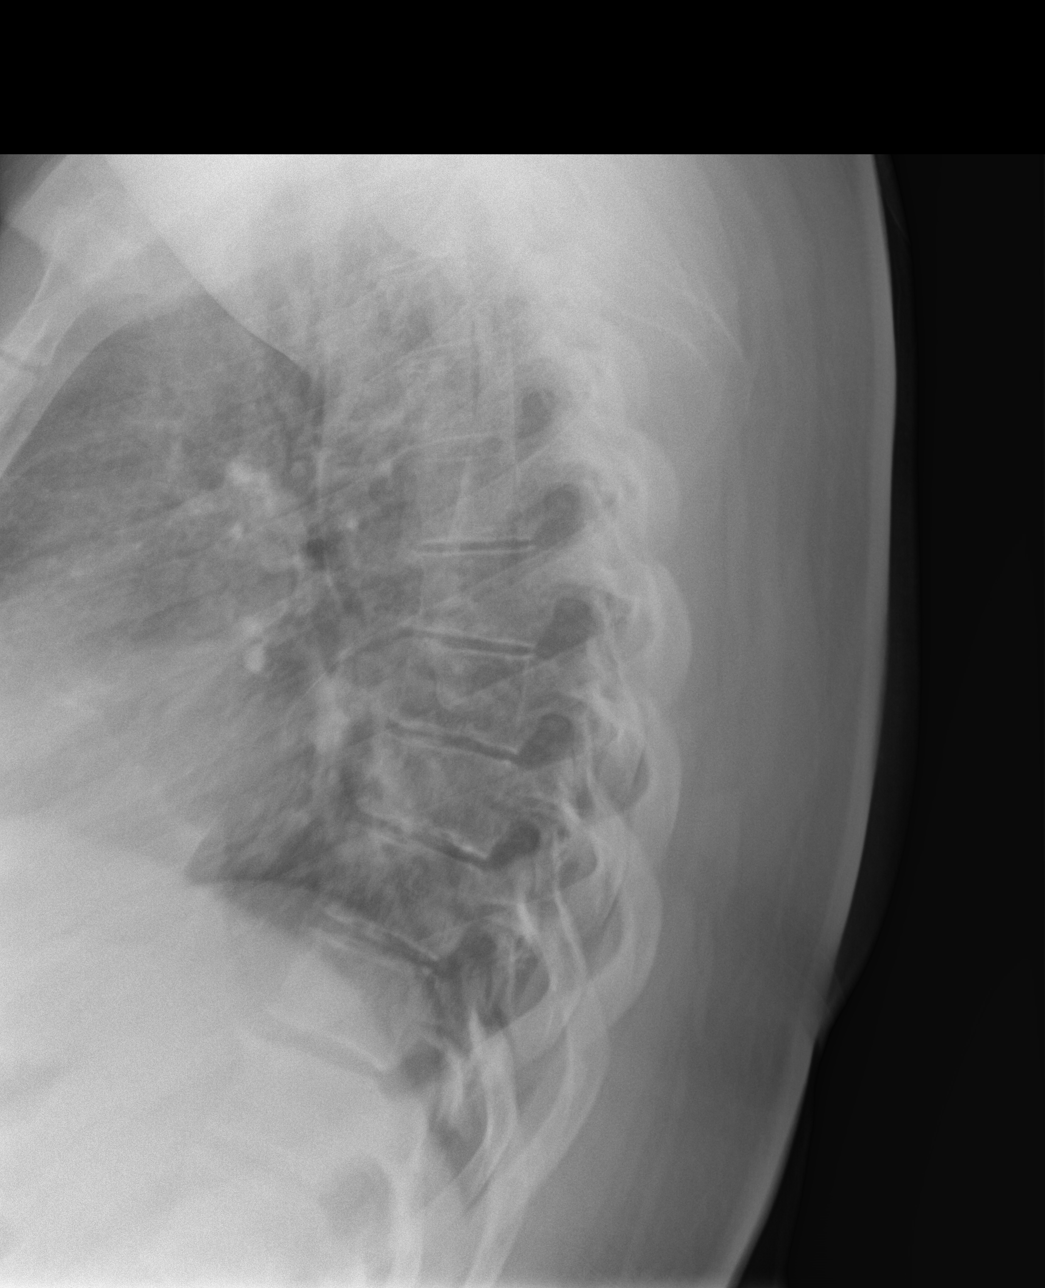

[t-spine swimmers]
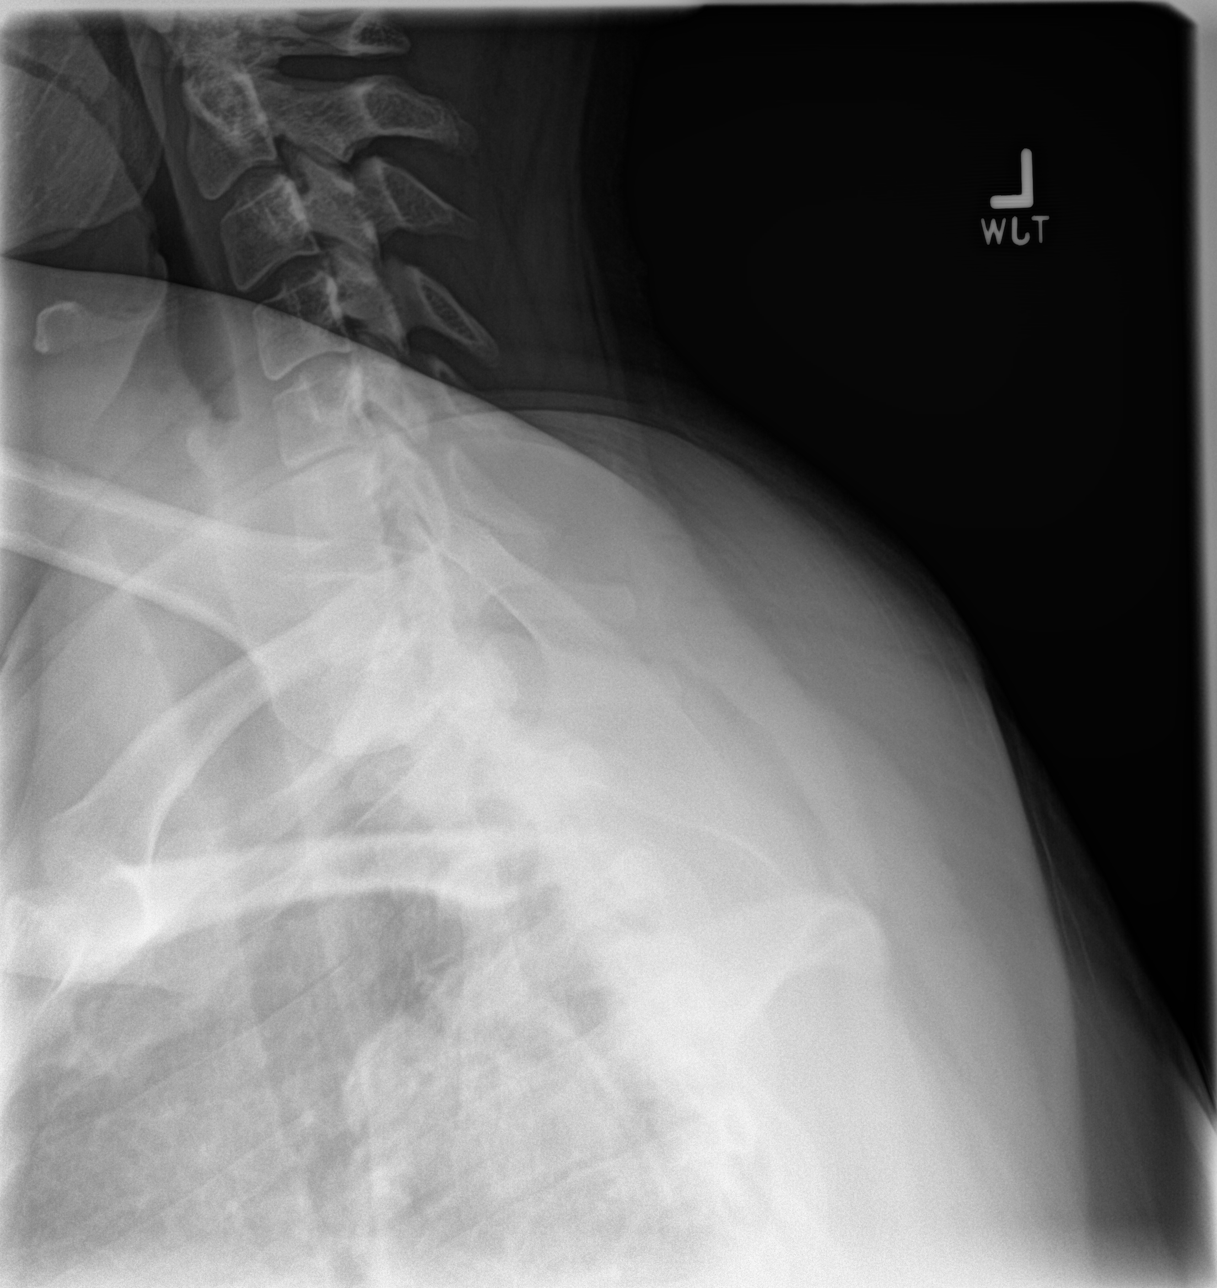

[4 of 4 positions shown; findings below may reference images not displayed]

FINDINGS: There is no evidence of thoracic spine fracture. Alignment is
normal. No other significant bone abnormalities are identified.
IMPRESSION: Negative.

## 2017-05-15 IMAGING — DX DG PELVIS 1-2V
1 series · 1 of 1 positions shown · non-contrast
Comparison: None.

CLINICAL DATA: Left buttock pain, fell down steps yesterday

EXAM:
PELVIS - 1-2 VIEW

[pelvis ap]
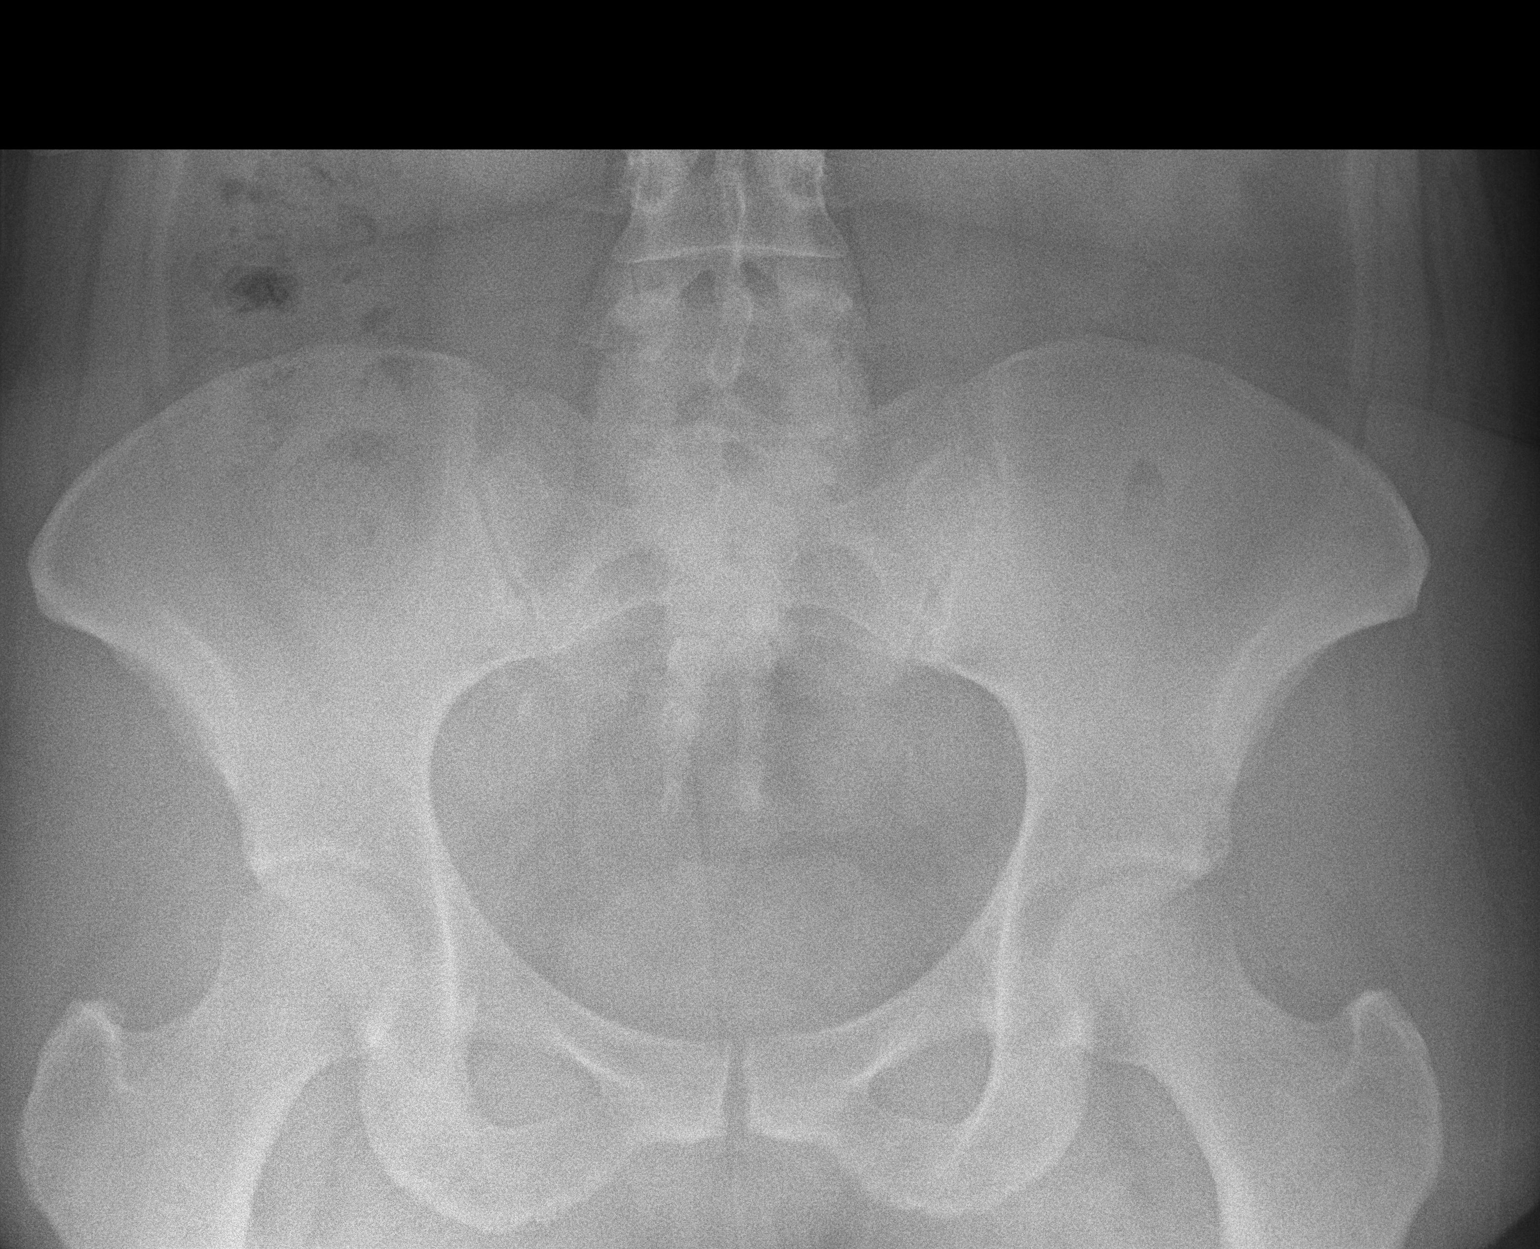

[1 of 1 positions shown; findings below may reference images not displayed]

FINDINGS: Single frontal view of the pelvis submitted. No gross fracture or
subluxation. No radiopaque foreign body. Bilateral hip joints are
symmetrical in appearance.
IMPRESSION: Negative.

## 2017-05-30 ENCOUNTER — Encounter: Payer: Managed Care, Other (non HMO) | Admitting: Internal Medicine

## 2017-05-31 ENCOUNTER — Other Ambulatory Visit: Payer: Self-pay | Admitting: Internal Medicine

## 2017-06-27 ENCOUNTER — Ambulatory Visit (INDEPENDENT_AMBULATORY_CARE_PROVIDER_SITE_OTHER): Payer: Managed Care, Other (non HMO) | Admitting: Internal Medicine

## 2017-06-27 ENCOUNTER — Encounter: Payer: Self-pay | Admitting: Internal Medicine

## 2017-06-27 ENCOUNTER — Other Ambulatory Visit: Payer: Self-pay | Admitting: Internal Medicine

## 2017-06-27 VITALS — BP 124/90 | HR 69 | Temp 98.4°F | Wt 251.1 lb

## 2017-06-27 DIAGNOSIS — Z0001 Encounter for general adult medical examination with abnormal findings: Secondary | ICD-10-CM

## 2017-06-27 DIAGNOSIS — S81801A Unspecified open wound, right lower leg, initial encounter: Secondary | ICD-10-CM | POA: Diagnosis not present

## 2017-06-27 DIAGNOSIS — I10 Essential (primary) hypertension: Secondary | ICD-10-CM | POA: Diagnosis not present

## 2017-06-27 LAB — COMPREHENSIVE METABOLIC PANEL
ALT: 18 U/L (ref 0–35)
AST: 16 U/L (ref 0–37)
Albumin: 4.1 g/dL (ref 3.5–5.2)
Alkaline Phosphatase: 94 U/L (ref 39–117)
BILIRUBIN TOTAL: 0.3 mg/dL (ref 0.2–1.2)
BUN: 19 mg/dL (ref 6–23)
CHLORIDE: 103 meq/L (ref 96–112)
CO2: 27 meq/L (ref 19–32)
CREATININE: 0.74 mg/dL (ref 0.40–1.20)
Calcium: 9.4 mg/dL (ref 8.4–10.5)
GFR: 92.09 mL/min (ref >60.00)
GLUCOSE: 90 mg/dL (ref 70–99)
Potassium: 4.2 mEq/L (ref 3.5–5.1)
SODIUM: 136 meq/L (ref 135–145)
Total Protein: 6.6 g/dL (ref 6.0–8.3)

## 2017-06-27 LAB — HEMOGLOBIN A1C: Hgb A1c MFr Bld: 5.7 % (ref 4.6–6.5)

## 2017-06-27 LAB — LIPID PANEL
CHOL/HDL RATIO: 4
Cholesterol: 208 mg/dL — ABNORMAL HIGH (ref 0–200)
HDL: 50.7 mg/dL (ref >39.00)
LDL CALC: 139 mg/dL — AB (ref 0–99)
NONHDL: 157.32
TRIGLYCERIDES: 92 mg/dL (ref 0.0–149.0)
VLDL: 18.4 mg/dL (ref 0.0–40.0)

## 2017-06-27 LAB — CBC
HCT: 43.4 % (ref 36.0–46.0)
Hemoglobin: 14.5 g/dL (ref 12.0–15.0)
MCHC: 33.3 g/dL (ref 30.0–36.0)
MCV: 93.4 fl (ref 78.0–100.0)
Platelets: 233 10*3/uL (ref 150.0–400.0)
RBC: 4.65 Mil/uL (ref 3.87–5.11)
RDW: 13.8 % (ref 11.5–15.5)
WBC: 10 10*3/uL (ref 4.0–10.5)

## 2017-06-27 MED ORDER — LABETALOL HCL 100 MG PO TABS: 100.0000 mg | ORAL_TABLET | Freq: Two times a day (BID) | ORAL | 11 refills | Status: DC

## 2017-06-27 NOTE — Assessment & Plan Note (Signed)
Controlled on Labetalol Refilled today

## 2017-06-27 NOTE — Patient Instructions (Signed)

## 2017-06-27 NOTE — Progress Notes (Signed)
Subjective:    Patient ID: Claudia Glenn, female    DOB: 12-02-1976, 41 y.o.   MRN: [id number]  HPI  Pt presents to the clinic today for her annual exam. She is also due to follow up HTN.  HTN: Her BP today is 124/90. She is taking Labetalol daily as prescribed. ECG from 09/2015 reviewed.  Flu: never Tetanus: 12/2009 Pap Smear: 09/2015, sees GYN Mammogram: never Vision Screening: yearly Dentist: annually  Diet: She does not eat a lot of meat. She consumes some fruits and veggies. She does eat fried foods. She drinks mostly Mt. Dew. Exercise: None  Review of Systems      Past Medical History:  Diagnosis Date  . Frequent headaches   . Hypertension     Current Outpatient Prescriptions  Medication Sig Dispense Refill  . labetalol (NORMODYNE) 100 MG tablet TAKE ONE TABLET BY MOUTH TWICE A DAY 60 tablet 0   No current facility-administered medications for this visit.     Allergies  Allergen Reactions  . Losartan Other (See Comments)    Chest tightness  . Tramadol Other (See Comments)    dizziness    Family History  Problem Relation Age of Onset  . Irregular heart beat Mother   . Cancer Neg Hx   . Diabetes Neg Hx   . Heart disease Neg Hx   . Stroke Neg Hx     Social History   Social History  . Marital status: Married    Spouse name: N/A  . Number of children: N/A  . Years of education: N/A   Occupational History  . Not on file.   Social History Main Topics  . Smoking status: Former Smoker    Packs/day: 0.50    Types: Cigarettes  . Smokeless tobacco: Never Used     Comment: quit 2014  . Alcohol use 0.0 oz/week     Comment: rare  . Drug use: No  . Sexual activity: Yes    Birth control/ protection: None   Other Topics Concern  . Not on file   Social History Narrative  . No narrative on file     Constitutional: Denies fever, malaise, fatigue, headache or abrupt weight changes.  HEENT: Pt reports nasal congestion. Denies eye pain, eye  redness, ear pain, ringing in the ears, wax buildup, runny nose, bloody nose, or sore throat. Respiratory: Denies difficulty breathing, shortness of breath, cough or sputum production.   Cardiovascular: Denies chest pain, chest tightness, palpitations or swelling in the hands or feet.  Gastrointestinal: Denies abdominal pain, bloating, constipation, diarrhea or blood in the stool.  GU: Denies urgency, frequency, pain with urination, burning sensation, blood in urine, odor or discharge. Musculoskeletal: Denies decrease in range of motion, difficulty with gait, muscle pain or joint pain and swelling.  Skin: Pt reports nonhealing round to right lower leg .Denies redness, rashes, lesions.  Neurological: Denies dizziness, difficulty with memory, difficulty with speech or problems with balance and coordination.  Psych: Denies anxiety, depression, SI/HI.  No other specific complaints in a complete review of systems (except as listed in HPI above).  Objective:   Physical Exam   BP 124/90   Pulse 69   Temp 98.4 F (36.9 C) (Oral)   Wt 251 lb 1.6 oz (113.9 kg)   LMP 06/13/2017   SpO2 98%   BMI 39.33 kg/m  Wt Readings from Last 3 Encounters:  06/27/17 251 lb 1.6 oz (113.9 kg)  05/23/16 245 lb (111.1 kg)  05/05/16 244  lb 8 oz (110.9 kg)    General: Appears her stated age, obese in NAD. Skin: Warm, dry and intact. Small linear laceration to over the achilles, appears to be a nick from shaving.  HEENT: Head: normal shape and size; Eyes: sclera white, no icterus, conjunctiva pink, PERRLA and EOMs intact; Ears: Tm's gray and intact, normal light reflex; Throat/Mouth: Teeth present, mucosa pink and moist, no exudate, lesions or ulcerations noted.  Neck:  Neck supple, trachea midline. No masses, lumps or thyromegaly present.  Cardiovascular: Normal rate and rhythm. S1,S2 noted.  No murmur, rubs or gallops noted. No JVD or BLE edema.  Pulmonary/Chest: Normal effort and positive vesicular breath  sounds. No respiratory distress. No wheezes, rales or ronchi noted.  Abdomen: Soft and nontender. Normal bowel sounds. No distention or masses noted. Liver, spleen and kidneys non palpable. Musculoskeletal: Strength 5/5 BUE/BLE. No difficulty with gait.  Neurological: Alert and oriented. Cranial nerves II-XII grossly intact. Coordination normal.  Psychiatric: Mood and affect normal. Behavior is normal. Judgment and thought content normal.     BMET    Component Value Date/Time   NA 137 05/23/2016 1509   K 4.4 05/23/2016 1509   CL 104 05/23/2016 1509   CO2 26 05/23/2016 1509   GLUCOSE 84 05/23/2016 1509   BUN 19 05/23/2016 1509   CREATININE 0.79 05/23/2016 1509   CALCIUM 9.2 05/23/2016 1509   GFRNONAA >60 10/02/2015 1212   GFRAA >60 10/02/2015 1212    Lipid Panel     Component Value Date/Time   CHOL 212 (H) 05/23/2016 1509   TRIG 107.0 05/23/2016 1509   HDL 45.50 05/23/2016 1509   CHOLHDL 5 05/23/2016 1509   VLDL 21.4 05/23/2016 1509   LDLCALC 145 (H) 05/23/2016 1509    CBC    Component Value Date/Time   WBC 12.9 (H) 05/23/2016 1509   RBC 4.49 05/23/2016 1509   HGB 14.0 05/23/2016 1509   HCT 41.8 05/23/2016 1509   PLT 231.0 05/23/2016 1509   MCV 93.1 05/23/2016 1509   MCH 30.5 10/02/2015 1212   MCHC 33.4 05/23/2016 1509   RDW 14.1 05/23/2016 1509    Hgb A1C Lab Results  Component Value Date   HGBA1C 5.5 05/23/2016           Assessment & Plan:   Preventative Health Maintenance:  Encouraged her to get a flu shot in the fall Tetanus UTD Pap smear UTD Will start screening mammograms at 35 Encouraged her to consume a balanced diet and exercise regimen Advised her to see an eye doctor and dentist annually Will check CBC, CMET, Lipid and A1C today  RTC in 1 year, sooner if needed Webb Silversmith, NP

## 2018-07-17 ENCOUNTER — Other Ambulatory Visit: Payer: Self-pay | Admitting: Internal Medicine

## 2018-08-18 ENCOUNTER — Other Ambulatory Visit: Payer: Self-pay | Admitting: Internal Medicine

## 2018-09-19 ENCOUNTER — Other Ambulatory Visit: Payer: Self-pay | Admitting: Internal Medicine

## 2018-10-07 ENCOUNTER — Encounter: Payer: Self-pay | Admitting: Internal Medicine

## 2018-10-07 ENCOUNTER — Ambulatory Visit: Payer: Managed Care, Other (non HMO) | Admitting: Internal Medicine

## 2018-10-07 VITALS — BP 124/84 | HR 66 | Temp 97.9°F | Ht 67.0 in | Wt 249.0 lb

## 2018-10-07 DIAGNOSIS — Z0000 Encounter for general adult medical examination without abnormal findings: Secondary | ICD-10-CM | POA: Diagnosis not present

## 2018-10-07 DIAGNOSIS — I10 Essential (primary) hypertension: Secondary | ICD-10-CM | POA: Diagnosis not present

## 2018-10-07 LAB — COMPREHENSIVE METABOLIC PANEL
ALT: 16 U/L (ref 0–35)
AST: 14 U/L (ref 0–37)
Albumin: 4.4 g/dL (ref 3.5–5.2)
Alkaline Phosphatase: 95 U/L (ref 39–117)
BUN: 11 mg/dL (ref 6–23)
CALCIUM: 9.5 mg/dL (ref 8.4–10.5)
CHLORIDE: 100 meq/L (ref 96–112)
CO2: 30 mEq/L (ref 19–32)
CREATININE: 0.79 mg/dL (ref 0.40–1.20)
GFR: 84.86 mL/min (ref >60.00)
Glucose, Bld: 93 mg/dL (ref 70–99)
Potassium: 4.8 mEq/L (ref 3.5–5.1)
SODIUM: 135 meq/L (ref 135–145)
Total Bilirubin: 0.4 mg/dL (ref 0.2–1.2)
Total Protein: 7 g/dL (ref 6.0–8.3)

## 2018-10-07 LAB — CBC
HCT: 44.9 % (ref 36.0–46.0)
Hemoglobin: 15.1 g/dL — ABNORMAL HIGH (ref 12.0–15.0)
MCHC: 33.6 g/dL (ref 30.0–36.0)
MCV: 93.2 fl (ref 78.0–100.0)
PLATELETS: 246 10*3/uL (ref 150.0–400.0)
RBC: 4.81 Mil/uL (ref 3.87–5.11)
RDW: 14.2 % (ref 11.5–15.5)
WBC: 10 10*3/uL (ref 4.0–10.5)

## 2018-10-07 LAB — LIPID PANEL
CHOL/HDL RATIO: 4
Cholesterol: 214 mg/dL — ABNORMAL HIGH (ref 0–200)
HDL: 50.2 mg/dL (ref >39.00)
LDL CALC: 134 mg/dL — AB (ref 0–99)
NonHDL: 163.94
TRIGLYCERIDES: 150 mg/dL — AB (ref 0.0–149.0)
VLDL: 30 mg/dL (ref 0.0–40.0)

## 2018-10-07 LAB — VITAMIN D 25 HYDROXY (VIT D DEFICIENCY, FRACTURES): VITD: 21.4 ng/mL — ABNORMAL LOW (ref 30.00–100.00)

## 2018-10-07 NOTE — Patient Instructions (Signed)

## 2018-10-07 NOTE — Assessment & Plan Note (Signed)
Controlled on Labetalol Reinforced DASH diet and exercise for weight loss

## 2018-10-07 NOTE — Progress Notes (Signed)
Subjective:    Patient ID: Claudia Glenn, female    DOB: 10-04-1976, 42 y.o.   MRN: [id number]  HPI  Pt presents to the clinic today for her annual exam.  HTN: Her BP today is 124/84. She is taking Labetalol as prescribed. ECG from 09/2015 reviewed.  Flu: never Tetanus: 12/2009 Pap Smear: 09/2015 Mammogram: never Vision Screening: annually Dentist: annually  Diet: She does eat some meat. She consumes some fruits and veggies daily. She does eat some fried foods. She drinks Mt. Dew, some water. Exercise: None  Review of Systems  Past Medical History:  Diagnosis Date  . Frequent headaches   . Hypertension     Current Outpatient Medications  Medication Sig Dispense Refill  . labetalol (NORMODYNE) 100 MG tablet TAKE ONE TABLET BY MOUTH TWICE A DAY 60 tablet 0   No current facility-administered medications for this visit.     Allergies  Allergen Reactions  . Losartan Other (See Comments)    Chest tightness  . Tramadol Other (See Comments)    dizziness    Family History  Problem Relation Age of Onset  . Irregular heart beat Mother   . Cancer Neg Hx   . Diabetes Neg Hx   . Heart disease Neg Hx   . Stroke Neg Hx     Social History   Socioeconomic History  . Marital status: Married    Spouse name: Not on file  . Number of children: Not on file  . Years of education: Not on file  . Highest education level: Not on file  Occupational History  . Not on file  Social Needs  . Financial resource strain: Not on file  . Food insecurity:    Worry: Not on file    Inability: Not on file  . Transportation needs:    Medical: Not on file    Non-medical: Not on file  Tobacco Use  . Smoking status: Former Smoker    Packs/day: 0.50    Types: Cigarettes  . Smokeless tobacco: Never Used  . Tobacco comment: quit 2014  Substance and Sexual Activity  . Alcohol use: Yes    Alcohol/week: 0.0 standard drinks    Comment: rare  . Drug use: No  . Sexual activity: Yes   Birth control/protection: None  Lifestyle  . Physical activity:    Days per week: Not on file    Minutes per session: Not on file  . Stress: Not on file  Relationships  . Social connections:    Talks on phone: Not on file    Gets together: Not on file    Attends religious service: Not on file    Active member of club or organization: Not on file    Attends meetings of clubs or organizations: Not on file    Relationship status: Not on file  . Intimate partner violence:    Fear of current or ex partner: Not on file    Emotionally abused: Not on file    Physically abused: Not on file    Forced sexual activity: Not on file  Other Topics Concern  . Not on file  Social History Narrative  . Not on file     Constitutional: Denies fever, malaise, fatigue, headache or abrupt weight changes.  HEENT: Denies eye pain, eye redness, ear pain, ringing in the ears, wax buildup, runny nose, nasal congestion, bloody nose, or sore throat. Respiratory: Denies difficulty breathing, shortness of breath, cough or sputum production.   Cardiovascular: Denies  chest pain, chest tightness, palpitations or swelling in the hands or feet.  Gastrointestinal: Pt reports intermittent blood in stool (hemorrhoids). Denies abdominal pain, bloating, constipation, diarrhea or blood in the stool.  GU: Denies urgency, frequency, pain with urination, burning sensation, blood in urine, odor or discharge. Musculoskeletal: Pt reports intermittent neck pain. Denies decrease in range of motion, difficulty with gait, or joint pain and swelling.  Skin: Denies redness, rashes, lesions or ulcercations.  Neurological: Denies dizziness, difficulty with memory, difficulty with speech or problems with balance and coordination.  Psych: Denies anxiety, depression, SI/HI.  No other specific complaints in a complete review of systems (except as listed in HPI above).     Objective:   Physical Exam   BP 124/84   Pulse 66   Temp  97.9 F (36.6 C) (Oral)   Ht 5' 7"  (1.702 m)   Wt 249 lb (112.9 kg)   LMP 09/22/2018   SpO2 98%   BMI 39.00 kg/m  Wt Readings from Last 3 Encounters:  10/07/18 249 lb (112.9 kg)  06/27/17 251 lb 1.6 oz (113.9 kg)  05/23/16 245 lb (111.1 kg)    General: Appears her stated age, obese, in NAD. Skin: Warm, dry and intact. No rashes, lesions or ulcerations noted. HEENT: Head: normal shape and size; Eyes: sclera white, no icterus, conjunctiva pink, PERRLA and EOMs intact; Ears: Tm's gray and intact, normal light reflex; Throat/Mouth: Teeth present, mucosa pink and moist, no exudate, lesions or ulcerations noted.  Neck:  Neck supple, trachea midline. No masses, lumps or thyromegaly present.  Cardiovascular: Normal rate and rhythm. S1,S2 noted.  No murmur, rubs or gallops noted. No JVD or BLE edema.  Pulmonary/Chest: Normal effort and positive vesicular breath sounds. No respiratory distress. No wheezes, rales or ronchi noted.  Abdomen: Soft and nontender. Normal bowel sounds. No distention or masses noted. Liver, spleen and kidneys non palpable. Musculoskeletal: Strength 5/5 BUE/BLE. No difficulty with gait.  Neurological: Alert and oriented. Cranial nerves II-XII grossly intact. Coordination normal.  Psychiatric: Mood and affect normal. Behavior is normal. Judgment and thought content normal.     BMET    Component Value Date/Time   NA 136 06/27/2017 1320   K 4.2 06/27/2017 1320   CL 103 06/27/2017 1320   CO2 27 06/27/2017 1320   GLUCOSE 90 06/27/2017 1320   BUN 19 06/27/2017 1320   CREATININE 0.74 06/27/2017 1320   CALCIUM 9.4 06/27/2017 1320   GFRNONAA >60 10/02/2015 1212   GFRAA >60 10/02/2015 1212    Lipid Panel     Component Value Date/Time   CHOL 208 (H) 06/27/2017 1320   TRIG 92.0 06/27/2017 1320   HDL 50.70 06/27/2017 1320   CHOLHDL 4 06/27/2017 1320   VLDL 18.4 06/27/2017 1320   LDLCALC 139 (H) 06/27/2017 1320    CBC    Component Value Date/Time   WBC 10.0  06/27/2017 1320   RBC 4.65 06/27/2017 1320   HGB 14.5 06/27/2017 1320   HCT 43.4 06/27/2017 1320   PLT 233.0 06/27/2017 1320   MCV 93.4 06/27/2017 1320   MCH 30.5 10/02/2015 1212   MCHC 33.3 06/27/2017 1320   RDW 13.8 06/27/2017 1320    Hgb A1C Lab Results  Component Value Date   HGBA1C 5.7 06/27/2017           Assessment & Plan:   Preventative Health Maintenance:  She declines flu shot today Tetanus UTD She will call GYN to schedule her pap smear No need for baseline  mammogram at this time Encouraged her to consume a balanced diet and exercise regimen Advised her to see an eye doctor and dentist annually Will check CBC, CMET, Lipid, and Vit D today  RTC in 1 year, sooner if needed Webb Silversmith, NP

## 2018-10-08 ENCOUNTER — Other Ambulatory Visit: Payer: Self-pay | Admitting: Internal Medicine

## 2018-10-08 DIAGNOSIS — E559 Vitamin D deficiency, unspecified: Secondary | ICD-10-CM

## 2018-10-08 MED ORDER — VITAMIN D (ERGOCALCIFEROL) 1.25 MG (50000 UNIT) PO CAPS: 50000.0000 [IU] | ORAL_CAPSULE | ORAL | 0 refills | Status: DC

## 2018-10-10 ENCOUNTER — Telehealth: Payer: Self-pay

## 2018-10-10 NOTE — Telephone Encounter (Signed)
Left detailed msg on VM per HIPAA  Vitamin D is low. Rx  Sent to pharmacy for vitamin D 50,000 units to take once a week for 12 weeks. Cholesterol stable but triglycerides slightly higher. Consume a low fat diet and increase aerobic exercise. Schedule a lab only appt to recheck vitamin D

## 2018-10-26 ENCOUNTER — Other Ambulatory Visit: Payer: Self-pay | Admitting: Internal Medicine

## 2018-12-30 ENCOUNTER — Other Ambulatory Visit: Payer: Self-pay | Admitting: Internal Medicine

## 2019-10-30 ENCOUNTER — Other Ambulatory Visit: Payer: Self-pay | Admitting: Internal Medicine

## 2019-11-30 ENCOUNTER — Other Ambulatory Visit: Payer: Self-pay | Admitting: Internal Medicine

## 2019-12-17 ENCOUNTER — Ambulatory Visit (INDEPENDENT_AMBULATORY_CARE_PROVIDER_SITE_OTHER): Payer: Managed Care, Other (non HMO) | Admitting: Internal Medicine

## 2019-12-17 ENCOUNTER — Other Ambulatory Visit: Payer: Self-pay

## 2019-12-17 ENCOUNTER — Encounter: Payer: Self-pay | Admitting: Internal Medicine

## 2019-12-17 ENCOUNTER — Telehealth: Payer: Self-pay | Admitting: Internal Medicine

## 2019-12-17 DIAGNOSIS — G44209 Tension-type headache, unspecified, not intractable: Secondary | ICD-10-CM | POA: Diagnosis not present

## 2019-12-17 MED ORDER — CYCLOBENZAPRINE HCL 10 MG PO TABS: 10.0000 mg | ORAL_TABLET | Freq: Two times a day (BID) | ORAL | 0 refills | Status: DC | PRN

## 2019-12-17 NOTE — Telephone Encounter (Signed)
Patient just had a virtual visit with Rollene Fare.  Patient said Rollene Fare was going to send a rx for a muscle relaxer to the pharmacy.  Patient wanted to make sure the rx is sent to [facility].

## 2019-12-17 NOTE — Telephone Encounter (Signed)
Sent to [facility] in [place]

## 2019-12-17 NOTE — Progress Notes (Signed)
Virtual Visit via Video Note  I connected with Claudia Glenn on 12/17/19 at  4:00 PM EST by a video enabled telemedicine application and verified that I am speaking with the correct person using two identifiers.  Location: Patient: Home Provider: Office   I discussed the limitations of evaluation and management by telemedicine and the availability of in person appointments. The patient expressed understanding and agreed to proceed.  History of Present Illness:  Pt reports left sided headache. This started 3 days ago and is constant. The headache is located on the left side of her head, inferior and posterior to her left ear. The pain and tightness radiates down the left side of her neck and into her left shoulder. She describes the pain as tight and tender to touch. She reports associated pressure in head upon waking which decreases throughout the morning. Pt reports lifting heavy boxes at work which may have caused muscle tightness. She has regular headaches around her period but reports that this headache is different. She denies dizziness/lightheadedness, fever/chills, body aches, or vision changes. She has tried Ibuprofen and heat with some relief. Patient is unable to check BP but has been taking her Labetalol as prescribed.   Past Medical History:  Diagnosis Date  . Frequent headaches   . Hypertension     Current Outpatient Medications  Medication Sig Dispense Refill  . Ibuprofen 200 MG CAPS Take 400 mg by mouth as needed.    . labetalol (NORMODYNE) 100 MG tablet Take 1 tablet (100 mg total) by mouth 2 (two) times daily. SCHEDULE PHYSICAL EXAM 60 tablet 0  . Vitamin D, Ergocalciferol, (DRISDOL) 50000 units CAPS capsule Take 1 capsule (50,000 Units total) by mouth every 7 (seven) days. 12 capsule 0   No current facility-administered medications for this visit.    Allergies  Allergen Reactions  . Losartan Other (See Comments)    Chest tightness  . Tramadol Other (See Comments)     dizziness    Family History  Problem Relation Age of Onset  . Irregular heart beat Mother   . Cancer Neg Hx   . Diabetes Neg Hx   . Heart disease Neg Hx   . Stroke Neg Hx     Social History   Socioeconomic History  . Marital status: Married    Spouse name: Not on file  . Number of children: Not on file  . Years of education: Not on file  . Highest education level: Not on file  Occupational History  . Not on file  Tobacco Use  . Smoking status: Former Smoker    Packs/day: 0.50    Types: Cigarettes  . Smokeless tobacco: Never Used  . Tobacco comment: quit 2014  Substance and Sexual Activity  . Alcohol use: Yes    Alcohol/week: 0.0 standard drinks    Comment: rare  . Drug use: No  . Sexual activity: Yes    Birth control/protection: None  Other Topics Concern  . Not on file  Social History Narrative  . Not on file   Social Determinants of Health   Financial Resource Strain:   . Difficulty of Paying Living Expenses: Not on file  Food Insecurity:   . Worried About Charity fundraiser in the Last Year: Not on file  . Ran Out of Food in the Last Year: Not on file  Transportation Needs:   . Lack of Transportation (Medical): Not on file  . Lack of Transportation (Non-Medical): Not on file  Physical Activity:   .  Days of Exercise per Week: Not on file  . Minutes of Exercise per Session: Not on file  Stress:   . Feeling of Stress : Not on file  Social Connections:   . Frequency of Communication with Friends and Family: Not on file  . Frequency of Social Gatherings with Friends and Family: Not on file  . Attends Religious Services: Not on file  . Active Member of Clubs or Organizations: Not on file  . Attends Archivist Meetings: Not on file  . Marital Status: Not on file  Intimate Partner Violence:   . Fear of Current or Ex-Partner: Not on file  . Emotionally Abused: Not on file  . Physically Abused: Not on file  . Sexually Abused: Not on file      Constitutional: Pt reports headache. Denies fever, malaise, fatigue, or abrupt weight changes.  HEENT: Denies eye pain, eye redness, ear pain, ringing in the ears, wax buildup, runny nose, nasal congestion, bloody nose, or sore throat. Respiratory: Denies difficulty breathing, shortness of breath, cough or sputum production.   Cardiovascular: Denies chest pain, chest tightness, palpitations or swelling in the hands or feet.  Musculoskeletal: Pt reports muscle pain in neck and left shoulder. Denies decrease in range of motion, difficulty with gait, or joint pain and swelling.  Neurological: Denies dizziness, difficulty with memory, difficulty with speech or problems with balance and coordination.   No other specific complaints in a complete review of systems (except as listed in HPI above).   Observations/Objective:  Wt Readings from Last 3 Encounters:  10/07/18 249 lb (112.9 kg)  06/27/17 251 lb 1.6 oz (113.9 kg)  05/23/16 245 lb (111.1 kg)    General: Appears her stated age, obese, in NAD. Pulmonary/Chest: Normal effort. No respiratory distress.  Musculoskeletal: Pain with flexion and rotation to the left. Normal extension and rotation to the right. Neurological: Alert and oriented.     BMET    Component Value Date/Time   NA 135 10/07/2018 1432   K 4.8 10/07/2018 1432   CL 100 10/07/2018 1432   CO2 30 10/07/2018 1432   GLUCOSE 93 10/07/2018 1432   BUN 11 10/07/2018 1432   CREATININE 0.79 10/07/2018 1432   CALCIUM 9.5 10/07/2018 1432   GFRNONAA >60 10/02/2015 1212   GFRAA >60 10/02/2015 1212    Lipid Panel     Component Value Date/Time   CHOL 214 (H) 10/07/2018 1432   TRIG 150.0 (H) 10/07/2018 1432   HDL 50.20 10/07/2018 1432   CHOLHDL 4 10/07/2018 1432   VLDL 30.0 10/07/2018 1432   LDLCALC 134 (H) 10/07/2018 1432    CBC    Component Value Date/Time   WBC 10.0 10/07/2018 1432   RBC 4.81 10/07/2018 1432   HGB 15.1 (H) 10/07/2018 1432   HCT 44.9  10/07/2018 1432   PLT 246.0 10/07/2018 1432   MCV 93.2 10/07/2018 1432   MCH 30.5 10/02/2015 1212   MCHC 33.6 10/07/2018 1432   RDW 14.2 10/07/2018 1432    Hgb A1C Lab Results  Component Value Date   HGBA1C 5.7 06/27/2017       Assessment and Plan:  Acute Headache:   Likely due to muscle strain from lifting.  Rx sent for Cyclobenzaprine 93m BIDPRN- sedation caution given Patient told to continue rest, heat and Ibuprofen PRN.  Discussed that neck massage and stretching may provide further relief.  Return precautions discussed  Follow Up Instructions:    I discussed the assessment and treatment plan with  the patient. The patient was provided an opportunity to ask questions and all were answered. The patient agreed with the plan and demonstrated an understanding of the instructions.   The patient was advised to call back or seek an in-person evaluation if the symptoms worsen or if the condition fails to improve as anticipated.    Webb Silversmith, NP

## 2019-12-18 ENCOUNTER — Encounter: Payer: Self-pay | Admitting: Internal Medicine

## 2019-12-18 NOTE — Patient Instructions (Signed)
Tension Headache, Adult A tension headache is pain, pressure, or aching in your head. Tension headaches can last from 30 minutes to several days. Follow these instructions at home: Managing pain  Take over-the-counter and prescription medicines only as told by your doctor.  When you have a headache, lie down in a dark, quiet room.  If told, put ice on your head and neck: ? Put ice in a plastic bag. ? Place a towel between your skin and the bag. ? Leave the ice on for 20 minutes, 2-3 times a day.  If told, put heat on the back of your neck. Do this as often as your doctor tells you to. Use the kind of heat that your doctor recommends, such as a moist heat pack or a heating pad. ? Place a towel between your skin and the heat. ? Leave the heat on for 20-30 minutes. ? Remove the heat if your skin turns bright red. Eating and drinking  Eat meals on a regular schedule.  Watch how much alcohol you drink: ? If you are a woman and are not pregnant, do not drink more than 1 drink a day. ? If you are a man, do not drink more than 2 drinks a day.  Drink enough fluid to keep your pee (urine) pale yellow.  Do not use a lot of caffeine, or stop using caffeine. Lifestyle  Get enough sleep. Get 7-9 hours of sleep each night. Or get the amount of sleep that your doctor tells you to.  At bedtime, remove all electronic devices from your room. Examples of electronic devices are computers, phones, and tablets.  Find ways to lessen your stress. Some things that can lessen stress are: ? Exercise. ? Deep breathing. ? Yoga. ? Music. ? Positive thoughts.  Sit up straight. Do not tighten (tense) your muscles.  Do not use any products that have nicotine or tobacco in them, such as cigarettes and e-cigarettes. If you need help quitting, ask your doctor. General instructions   Keep all follow-up visits as told by your doctor. This is important.  Avoid things that can bring on headaches. Keep a  journal to find out if certain things bring on headaches. For example, write down: ? What you eat and drink. ? How much sleep you get. ? Any change to your diet or medicines. Contact a doctor if:  Your headache does not get better.  Your headache comes back.  You have a headache and sounds, light, or smells bother you.  You feel sick to your stomach (nauseous) or you throw up (vomit).  Your stomach hurts. Get help right away if:  You suddenly get a very bad headache along with any of these: ? A stiff neck. ? Feeling sick to your stomach. ? Throwing up. ? Feeling weak. ? Trouble seeing. ? Feeling short of breath. ? A rash. ? Feeling unusually sleepy. ? Trouble speaking. ? Pain in your eye or ear. ? Trouble walking or balancing. ? Feeling like you will pass out (faint). ? Passing out. Summary  A tension headache is pain, pressure, or aching in your head.  Tension headaches can last from 30 minutes to several days.  Lifestyle changes and medicines may help relieve pain. This information is not intended to replace advice given to you by your health care provider. Make sure you discuss any questions you have with your health care provider. Document Revised: 09/24/2019 Document Reviewed: 03/09/2017 Elsevier Patient Education  [employer].

## 2019-12-20 ENCOUNTER — Emergency Department (HOSPITAL_COMMUNITY): Payer: Managed Care, Other (non HMO)

## 2019-12-20 ENCOUNTER — Other Ambulatory Visit: Payer: Self-pay

## 2019-12-20 ENCOUNTER — Emergency Department (HOSPITAL_COMMUNITY)
Admission: EM | Admit: 2019-12-20 | Discharge: 2019-12-20 | Disposition: A | Discharge status: Home / Self Care | Payer: Managed Care, Other (non HMO) | Attending: Emergency Medicine | Admitting: Emergency Medicine

## 2019-12-20 ENCOUNTER — Encounter (HOSPITAL_COMMUNITY): Payer: Self-pay

## 2019-12-20 DIAGNOSIS — R519 Headache, unspecified: Secondary | ICD-10-CM | POA: Diagnosis present

## 2019-12-20 DIAGNOSIS — Z79899 Other long term (current) drug therapy: Secondary | ICD-10-CM | POA: Insufficient documentation

## 2019-12-20 DIAGNOSIS — M542 Cervicalgia: Secondary | ICD-10-CM | POA: Diagnosis not present

## 2019-12-20 DIAGNOSIS — Z87891 Personal history of nicotine dependence: Secondary | ICD-10-CM | POA: Insufficient documentation

## 2019-12-20 DIAGNOSIS — I10 Essential (primary) hypertension: Secondary | ICD-10-CM | POA: Insufficient documentation

## 2019-12-20 MED ORDER — HYDROCODONE-ACETAMINOPHEN 5-325 MG PO TABS: 1.0000 | ORAL_TABLET | Freq: Four times a day (QID) | ORAL | 0 refills | Status: DC | PRN

## 2019-12-20 MED ORDER — HYDROMORPHONE HCL 2 MG/ML IJ SOLN
2.0000 mg | Freq: Once | INTRAMUSCULAR | Status: AC
  Administered 2019-12-20: 2 mg via INTRAMUSCULAR
  Filled 2019-12-20: qty 1

## 2019-12-20 NOTE — ED Triage Notes (Signed)
Pt reports left sided neck pain that radiates around left ear for 6 days. Hurts worse  w movement to left . Pain is constant , dull and sharpe at times. PCP called in flexeril with no relief

## 2019-12-20 NOTE — ED Provider Notes (Addendum)
[facility] EMERGENCY DEPARTMENT Provider Note   CSN: [id number] Arrival date & time: 12/20/19  4818     History Chief Complaint  Patient presents with  . Neck Pain    Claudia Glenn is a 44 y.o. female.  Patient with a complaint of pain left occipital temporal area mostly just kind of behind the left ear.  Been ongoing for a week.  Some discomfort in the upper part of the neck no discomfort in the arms.  The symptoms are made worse by ice movement of the head on the neck.  No nausea vomiting no hearing changes no true ear pain.  No teeth pain.  Pain does radiate some towards the jaw.  No chest pain no shortness of breath no visual changes.  No prior symptoms.  Patient's had cervical radiculopathy in 2017 but that was more to neck pain involving arms.  Patient was seen by primary care doctor virtually was started on muscle relaxers.  Patient states not helping.        Past Medical History:  Diagnosis Date  . Frequent headaches   . Hypertension     Patient Active Problem List   Diagnosis Date Noted  . HTN (hypertension) 09/14/2015    Past Surgical History:  Procedure Laterality Date  . WISDOM TOOTH EXTRACTION       OB History   No obstetric history on file.     Family History  Problem Relation Age of Onset  . Irregular heart beat Mother   . Cancer Neg Hx   . Diabetes Neg Hx   . Heart disease Neg Hx   . Stroke Neg Hx     Social History   Tobacco Use  . Smoking status: Former Smoker    Packs/day: 0.50    Types: Cigarettes  . Smokeless tobacco: Never Used  . Tobacco comment: quit 2014  Substance Use Topics  . Alcohol use: Not Currently    Alcohol/week: 0.0 standard drinks    Comment: rare  . Drug use: No    Home Medications Prior to Admission medications   Medication Sig Start Date End Date Taking? Authorizing Provider  cyclobenzaprine (FLEXERIL) 10 MG tablet Take 1 tablet (10 mg total) by mouth 2 (two) times daily as needed for muscle spasms. 12/17/19   Yes Jearld Fenton, NP  Ibuprofen 200 MG CAPS Take 400 mg by mouth as needed.   Yes [provider]  labetalol (NORMODYNE) 100 MG tablet Take 1 tablet (100 mg total) by mouth 2 (two) times daily. SCHEDULE PHYSICAL EXAM 12/01/19  Yes Baity, Coralie Keens, NP  HYDROcodone-acetaminophen (NORCO/VICODIN) 5-325 MG tablet Take 1 tablet by mouth every 6 (six) hours as needed. 12/20/19   Fredia Sorrow, MD    Allergies    Losartan and Tramadol  Review of Systems   Review of Systems  Constitutional: Negative for chills and fever.  HENT: Negative for congestion, dental problem, ear pain, facial swelling, hearing loss, rhinorrhea, sore throat and trouble swallowing.   Eyes: Negative for visual disturbance.  Respiratory: Negative for cough and shortness of breath.   Cardiovascular: Negative for chest pain and leg swelling.  Gastrointestinal: Negative for abdominal pain, diarrhea, nausea and vomiting.  Genitourinary: Negative for dysuria.  Musculoskeletal: Positive for neck pain. Negative for back pain.  Skin: Negative for rash.  Neurological: Positive for headaches. Negative for dizziness and light-headedness.  Hematological: Does not bruise/bleed easily.  Psychiatric/Behavioral: Negative for confusion.    Physical Exam Updated Vital Signs BP (!) 185/97  Pulse 78   Temp 98.1 F (36.7 C) (Oral)   Ht 1.727 m (5' 8" )   Wt 113.4 kg   LMP 12/12/2019   SpO2 98%   BMI 38.01 kg/m   Physical Exam Vitals and nursing note reviewed.  Constitutional:      General: She is not in acute distress.    Appearance: Normal appearance. She is well-developed.  HENT:     Head: Normocephalic and atraumatic.     Comments: No tenderness to palpation at the mastoid sinus area no tenderness to palpation above that area where most of the pain is.  No pain with movement of the pinna.  In addition no rash.    Left Ear: Tympanic membrane, ear canal and external ear normal.     Mouth/Throat:     Pharynx:  Oropharynx is clear.  Eyes:     Extraocular Movements: Extraocular movements intact.     Conjunctiva/sclera: Conjunctivae normal.     Pupils: Pupils are equal, round, and reactive to light.  Cardiovascular:     Rate and Rhythm: Normal rate and regular rhythm.     Heart sounds: No murmur.  Pulmonary:     Effort: Pulmonary effort is normal. No respiratory distress.     Breath sounds: Normal breath sounds.  Abdominal:     Palpations: Abdomen is soft.     Tenderness: There is no abdominal tenderness.  Musculoskeletal:        General: Normal range of motion.     Cervical back: Normal range of motion and neck supple.  Skin:    General: Skin is warm and dry.  Neurological:     General: No focal deficit present.     Mental Status: She is alert and oriented to person, place, and time.     Cranial Nerves: No cranial nerve deficit.     Sensory: No sensory deficit.     Motor: No weakness.     Coordination: Coordination normal.     ED Results / Procedures / Treatments   Labs (all labs ordered are listed, but only abnormal results are displayed) Labs Reviewed - No data to display  EKG None  Radiology CT Head Wo Contrast  Result Date: 12/20/2019 CLINICAL DATA:  Headache and cervicalgia EXAM: CT HEAD WITHOUT CONTRAST CT CERVICAL SPINE WITHOUT CONTRAST TECHNIQUE: Multidetector CT imaging of the head and cervical spine was performed following the standard protocol without intravenous contrast. Multiplanar CT image reconstructions of the cervical spine were also generated. COMPARISON:  None. FINDINGS: CT HEAD FINDINGS Brain: Ventricles are normal in size and configuration. There is no intracranial mass, hemorrhage, extra-axial fluid collection, or midline shift. Brain parenchyma appears unremarkable. No evident acute infarct. Vascular: No hyperdense vessel. No appreciable vascular calcification. Skull: The bony calvarium appears intact. Sinuses/Orbits: Visualized paranasal sinuses are clear.  Orbits appear symmetric bilaterally. Other: Mastoid air cells are clear. CT CERVICAL SPINE FINDINGS Alignment: There is slight mid to lower cervical dextroscoliosis. There is no evident spondylolisthesis. Skull base and vertebrae: Skull base and craniocervical junction regions appear normal. No evident fracture. No blastic or lytic bone lesions. Soft tissues and spinal canal: Prevertebral soft tissues and predental space regions are normal. There is no evident cord or canal hematoma. No paraspinous lesions are evident. Disc levels: There is moderate disc space narrowing at C5-6. There is slight disc space narrowing at C4-5. At C2-3, there is minimal facet hypertrophy bilaterally. No nerve root edema or effacement. No disc extrusion or stenosis. At C3-4, there is  mild facet hypertrophy bilaterally. There is no appreciable nerve root edema or effacement. No disc extrusion or stenosis. At C4-5, there is slight facet hypertrophy bilaterally. There is mild exit foraminal narrowing on the left due to bony hypertrophy. No nerve root edema or effacement. No disc extrusion or stenosis. At C5-6, there is exit foraminal narrowing on the left, moderate, due to bony hypertrophy. No nerve root edema or effacement. No disc extrusion or stenosis. At C6-7, there is mild facet hypertrophy bilaterally. No nerve root edema or effacement. No disc extrusion or stenosis. At C7-T1, there is slight facet hypertrophy bilaterally. No nerve root edema or effacement. No disc extrusion or stenosis. Upper chest: Visualized upper lung regions are clear. Other: None IMPRESSION: CT head: Study within normal limits. CT cervical spine: 1. No fracture or spondylolisthesis. Slight lower cervical dextroscoliosis. 2. Osteoarthritic change at several levels. Moderate exit foraminal narrowing on the left at C5-6 due to bony hypertrophy. A slightly lesser degree of exit foraminal narrowing is noted at C4-5 on the left due to bony hypertrophy. No frank disc  extrusion or stenosis. Electronically Signed   By: Lowella Grip III M.D.   On: 12/20/2019 10:03   CT Cervical Spine Wo Contrast  Result Date: 12/20/2019 : CT head and CT cervical spine reports are combined into a single dictation. Electronically Signed   By: Lowella Grip III M.D.   On: 12/20/2019 10:04    Procedures Procedures (including critical care time)  Medications Ordered in ED Medications  HYDROmorphone (DILAUDID) injection 2 mg (2 mg Intramuscular Given 12/20/19 0954)    ED Course  I have reviewed the triage vital signs and the nursing notes.  Pertinent labs & imaging results that were available during my care of the patient were reviewed by me and considered in my medical decision making (see chart for details).    MDM Rules/Calculators/A&P                      Pain somewhat suggestive of may be neuralgia.  But it is kind around the mastoid sinus area will get CT head and neck.  This could represent a pinched nerve at C1-C2 area.  Certainly no radiculopathy.  Is also unusual the pain goes up.  Making it more sound like may be an occipital neuralgia.  The other possible consideration would be shingles as the cause of the pain.  But would have expected a rash by now.  Patient is on day 7 of the pain but there is possibility could be shingles without the rash.  Will have patient follow-up with neurology.  Head CT CT neck without any significant findings.  Did get some improvement with the hydromorphone.  We will continue with some hydrocodone at home and follow-up with neurology.   Final Clinical Impression(s) / ED Diagnoses Final diagnoses:  Occipital pain    Rx / DC Orders ED Discharge Orders         Ordered    HYDROcodone-acetaminophen (NORCO/VICODIN) 5-325 MG tablet  Every 6 hours PRN     12/20/19 1032           Fredia Sorrow, MD 12/20/19 1034    Fredia Sorrow, MD 12/20/19 1035

## 2019-12-20 NOTE — Discharge Instructions (Addendum)
Make an appointment to follow-up with neurology.  Continue to check the scalp area for rash.  That could be a sign of the pain being from developing shingles.  Take the pain medication as directed.

## 2019-12-23 ENCOUNTER — Telehealth: Payer: Self-pay

## 2019-12-23 NOTE — Telephone Encounter (Signed)
Pt had virtual visit with Avie Echevaria NP on 12/17/19 and h/a did not get better and pt went to [facility] ED on 12/20/19. Muscle relaxer did not help and pt was referred to neurologist in [place]; that office does not have an appt for 2 wks and needs referral from PCP for neurologist. Hydrocodone apap 5-352m does not help pain. Pt said the ibuprofen liquid gels taking 3-4 makes the pain manageable. Pt request referral to a different neurologist that pt can see before 2 wks. Pt also wants BP taken due to elevated BP at ED. Pt has no covid symptoms except head hurts when first wakes up and when pt moves around for a while then pain localizes behind lt ear. , no travel and no known exposure to + covid.  Pt has in office appt on 12/24/19 at 2 PM (pt will be at office at 1:45) oked by RAvie EchevariaNP. ED precautions given and pt voiced understanding.

## 2019-12-23 NOTE — Telephone Encounter (Signed)
Will discuss at upcoming appt.

## 2019-12-24 ENCOUNTER — Ambulatory Visit: Payer: Managed Care, Other (non HMO) | Admitting: Internal Medicine

## 2019-12-24 ENCOUNTER — Other Ambulatory Visit: Payer: Self-pay

## 2019-12-24 ENCOUNTER — Encounter: Payer: Self-pay | Admitting: Internal Medicine

## 2019-12-24 VITALS — BP 150/88 | HR 77 | Temp 97.6°F | Wt 258.0 lb

## 2019-12-24 DIAGNOSIS — I10 Essential (primary) hypertension: Secondary | ICD-10-CM

## 2019-12-24 DIAGNOSIS — R519 Headache, unspecified: Secondary | ICD-10-CM | POA: Diagnosis not present

## 2019-12-24 MED ORDER — AMITRIPTYLINE HCL 25 MG PO TABS: 25.0000 mg | ORAL_TABLET | Freq: Every day | ORAL | 0 refills | Status: DC

## 2019-12-24 MED ORDER — LABETALOL HCL 100 MG PO TABS: 100.0000 mg | ORAL_TABLET | Freq: Three times a day (TID) | ORAL | 2 refills | Status: DC

## 2019-12-24 NOTE — Progress Notes (Signed)
Subjective:    Patient ID: Claudia Glenn, female    DOB: May 27, 1976, 44 y.o.   MRN: [id number]  HPI  Pt presents to the clinic today for ER follow up. Went to the ER 1/9 with c/o headache in the left occipital region. The pain radiated into the left side of her neck. She was seen for the same on 1/6, given Flexeril which she reports did not provide any relief. 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 She was given Hydromorphone in the ER with relief and discharged with Hydrocodone. Since discharge, she describes the pain as constant throbbing in left occipital region with occasional radiation to left ear and left shoulder precipitated by movement. she has been able to use heating pad and take Ibuprofen 6108m q6 hours with relief, Hydrocodone does not provide relief. She took off work 1/7-10 and rest did not provide relief.     Review of Systems      Past Medical History:  Diagnosis Date  . Frequent headaches   . Hypertension     Current Outpatient Medications  Medication Sig Dispense Refill  . cyclobenzaprine (FLEXERIL) 10 MG tablet Take 1 tablet (10 mg total) by mouth 2 (two) times daily as needed for muscle spasms. 15 tablet 0  . HYDROcodone-acetaminophen (NORCO/VICODIN) 5-325 MG tablet Take 1 tablet by mouth every 6 (six) hours as needed. 14 tablet 0  . Ibuprofen 200 MG CAPS Take 400 mg by mouth as needed.    . labetalol (NORMODYNE) 100 MG tablet Take 1 tablet (100 mg total) by mouth 2 (two) times daily. SCHEDULE PHYSICAL EXAM 60 tablet 0   No current facility-administered medications for this visit.    Allergies  Allergen Reactions  . Losartan Other (See Comments)    Chest tightness  . Tramadol Other (See Comments)    dizziness    Family History  Problem Relation Age of Onset  . Irregular heart beat Mother   . Cancer Neg Hx   . Diabetes Neg Hx   . Heart disease Neg Hx   . Stroke Neg Hx     Social  History   Socioeconomic History  . Marital status: Married    Spouse name: Not on file  . Number of children: Not on file  . Years of education: Not on file  . Highest education level: Not on file  Occupational History  . Not on file  Tobacco Use  . Smoking status: Former Smoker    Packs/day: 0.50    Types: Cigarettes  . Smokeless tobacco: Never Used  . Tobacco comment: quit 2014  Substance and Sexual Activity  . Alcohol use: Not Currently    Alcohol/week: 0.0 standard drinks    Comment: rare  . Drug use: No  . Sexual activity: Yes    Birth control/protection: None  Other Topics Concern  . Not on file  Social History Narrative  . Not on file   Social Determinants of Health   Financial Resource Strain:   . Difficulty of Paying Living Expenses: Not on file  Food Insecurity:   . Worried About RCharity fundraiserin the Last Year: Not on file  . Ran Out of Food in the Last Year: Not on file  Transportation Needs:   . Lack of Transportation (Medical): Not on file  . Lack of Transportation (Non-Medical): Not on file  Physical Activity:   . Days of Exercise per Week: Not on file  .  Minutes of Exercise per Session: Not on file  Stress:   . Feeling of Stress : Not on file  Social Connections:   . Frequency of Communication with Friends and Family: Not on file  . Frequency of Social Gatherings with Friends and Family: Not on file  . Attends Religious Services: Not on file  . Active Member of Clubs or Organizations: Not on file  . Attends Archivist Meetings: Not on file  . Marital Status: Not on file  Intimate Partner Violence:   . Fear of Current or Ex-Partner: Not on file  . Emotionally Abused: Not on file  . Physically Abused: Not on file  . Sexually Abused: Not on file     Constitutional: Pt reports headache. Denies fever, malaise, fatigue, or abrupt weight changes.  HEENT: Denies eye pain, eye redness, ear pain, ringing in the ears, wax buildup, runny  nose, nasal congestion, bloody nose, or sore throat. Respiratory: Denies difficulty breathing, shortness of breath, cough or sputum production.   Cardiovascular: Denies chest pain, chest tightness, palpitations or swelling in the hands or feet.  Musculoskeletal: Pt reports left side neck pain. Denies decrease in range of motion, difficulty with gait, or joint swelling.   Neurological: Denies dizziness, difficulty with memory, difficulty with speech or problems with balance and coordination.     No other specific complaints in a complete review of systems (except as listed in HPI above).  Objective:   Physical Exam  BP (!) 150/88   Pulse 77   Temp 97.6 F (36.4 C) (Temporal)   Wt 258 lb (117 kg)   LMP 12/12/2019   SpO2 98%   BMI 39.23 kg/m   Wt Readings from Last 3 Encounters:  12/20/19 250 lb (113.4 kg)  10/07/18 249 lb (112.9 kg)  06/27/17 251 lb 1.6 oz (113.9 kg)    General: Appears her stated age, obese, in NAD. Skin: Warm, dry and intact. No rashes, lesions or ulcerations noted. HEENT: Head: normal shape and size; Eyes: Perrla, EOM's intact. Musculoskeletal: Normal flexion, extension and rotation of the cervical spine. No bony tenderness noted over the cervical spine. Pinpoint tenderness noted at the base of the left side of the head. Neurological: Alert and oriented.  Coordination normal.   BMET    Component Value Date/Time   NA 135 10/07/2018 1432   K 4.8 10/07/2018 1432   CL 100 10/07/2018 1432   CO2 30 10/07/2018 1432   GLUCOSE 93 10/07/2018 1432   BUN 11 10/07/2018 1432   CREATININE 0.79 10/07/2018 1432   CALCIUM 9.5 10/07/2018 1432   GFRNONAA >60 10/02/2015 1212   GFRAA >60 10/02/2015 1212    Lipid Panel     Component Value Date/Time   CHOL 214 (H) 10/07/2018 1432   TRIG 150.0 (H) 10/07/2018 1432   HDL 50.20 10/07/2018 1432   CHOLHDL 4 10/07/2018 1432   VLDL 30.0 10/07/2018 1432   LDLCALC 134 (H) 10/07/2018 1432    CBC    Component Value  Date/Time   WBC 10.0 10/07/2018 1432   RBC 4.81 10/07/2018 1432   HGB 15.1 (H) 10/07/2018 1432   HCT 44.9 10/07/2018 1432   PLT 246.0 10/07/2018 1432   MCV 93.2 10/07/2018 1432   MCH 30.5 10/02/2015 1212   MCHC 33.6 10/07/2018 1432   RDW 14.2 10/07/2018 1432    Hgb A1C Lab Results  Component Value Date   HGBA1C 5.7 06/27/2017            Assessment &  Plan:   ER Follow Up for Acute Non Intractable Headache, HTN:  ER notes, labs and imaging reviewed Rx sent for Amitriptyline 65m QHS. Sedation caution discussed Increase Labetalol to 1050mTID. Referral provided to neurology. Note given to work no more than 6 hours a day until visit with neuro.  Continue Ibuprofen and heating pad PRN. D/C Hydrocodone and Cyclobenzaprine.     ReWebb SilversmithNP This visit occurred during the SARS-CoV-2 public health emergency.  Safety protocols were in place, including screening questions prior to the visit, additional usage of staff PPE, and extensive cleaning of exam room while observing appropriate contact time as indicated for disinfecting solutions.

## 2019-12-24 NOTE — Patient Instructions (Signed)
Form - Headache Record There are many types and causes of headaches. A headache record can help guide your treatment plan. Use this form to record the details. Bring this form with you to your follow-up visits. Follow your health care provider's instructions on how to describe your headache. You may be asked to:  Use a pain scale. This is a tool to rate the intensity of your headache using words or numbers.  Describe what your headache feels like, such as dull, achy, throbbing, or sharp. Headache record Date: _______________ Time (from start to end): ____________________ Location of the headache: _________________________  Intensity of the headache: ____________________ Description of the headache: ______________________________________________________________  Hours of sleep the night before the headache: __________  Food or drinks before the headache started: ______________________________________________________________________________________  Events before the headache started: _______________________________________________________________________________________________  Symptoms before the headache started: __________________________________________________________________________________________  Symptoms during the headache: __________________________________________________________________________________________________  Treatment: ________________________________________________________________________________________________________________  Effect of treatment: _________________________________________________________________________________________________________  Other comments: ___________________________________________________________________________________________________________ Date: _______________ Time (from start to end): ____________________ Location of the headache: _________________________  Intensity of the headache: ____________________ Description of the  headache: ______________________________________________________________  Hours of sleep the night before the headache: __________  Food or drinks before the headache started: ______________________________________________________________________________________  Events before the headache started: ____________________________________________________________________________________________  Symptoms before the headache started: _________________________________________________________________________________________  Symptoms during the headache: _______________________________________________________________________________________________  Treatment: ________________________________________________________________________________________________________________  Effect of treatment: _________________________________________________________________________________________________________  Other comments: ___________________________________________________________________________________________________________ Date: _______________ Time (from start to end): ____________________ Location of the headache: _________________________  Intensity of the headache: ____________________ Description of the headache: ______________________________________________________________  Hours of sleep the night before the headache: __________  Food or drinks before the headache started: ______________________________________________________________________________________  Events before the headache started: ____________________________________________________________________________________________  Symptoms before the headache started: _________________________________________________________________________________________  Symptoms during the headache: _______________________________________________________________________________________________  Treatment:  ________________________________________________________________________________________________________________  Effect of treatment: _________________________________________________________________________________________________________  Other comments: ___________________________________________________________________________________________________________ Date: _______________ Time (from start to end): ____________________ Location of the headache: _________________________  Intensity of the headache: ____________________ Description of the headache: ______________________________________________________________  Hours of sleep the night before the headache: _________  Food or drinks before the headache started: ______________________________________________________________________________________  Events before the headache started: ____________________________________________________________________________________________  Symptoms before the headache started: _________________________________________________________________________________________  Symptoms during the headache: _______________________________________________________________________________________________  Treatment: ________________________________________________________________________________________________________________  Effect of treatment: _________________________________________________________________________________________________________  Other comments: ___________________________________________________________________________________________________________ Date: _______________ Time (from start to end): ____________________ Location of the headache: _________________________  Intensity of the headache: ____________________ Description of the headache: ______________________________________________________________  Hours of sleep the night before the headache: _________  Food or drinks  before the headache started: ______________________________________________________________________________________  Events before the headache started: ____________________________________________________________________________________________  Symptoms before the headache started: _________________________________________________________________________________________  Symptoms during the headache: _______________________________________________________________________________________________  Treatment: ________________________________________________________________________________________________________________  Effect of treatment: _________________________________________________________________________________________________________  Other comments: ___________________________________________________________________________________________________________ This information is not intended to replace advice given to you by your health care provider. Make sure you discuss any questions you have with your health care provider. Document Revised: 12/16/2018 Document Reviewed: 12/16/2018 Elsevier Patient Education  [place].

## 2019-12-28 ENCOUNTER — Other Ambulatory Visit: Payer: Self-pay | Admitting: Internal Medicine

## 2020-01-20 ENCOUNTER — Other Ambulatory Visit: Payer: Self-pay | Admitting: Internal Medicine

## 2020-02-16 ENCOUNTER — Other Ambulatory Visit: Payer: Self-pay | Admitting: Internal Medicine

## 2020-03-31 ENCOUNTER — Other Ambulatory Visit: Payer: Self-pay | Admitting: Internal Medicine

## 2020-04-12 ENCOUNTER — Other Ambulatory Visit: Payer: Self-pay | Admitting: Internal Medicine

## 2020-05-08 ENCOUNTER — Other Ambulatory Visit: Payer: Self-pay | Admitting: Internal Medicine

## 2020-05-11 ENCOUNTER — Other Ambulatory Visit: Payer: Self-pay | Admitting: Internal Medicine

## 2020-06-06 ENCOUNTER — Other Ambulatory Visit: Payer: Self-pay | Admitting: Internal Medicine

## 2020-06-09 ENCOUNTER — Other Ambulatory Visit: Payer: Self-pay | Admitting: Internal Medicine

## 2020-06-17 ENCOUNTER — Other Ambulatory Visit: Payer: Self-pay | Admitting: Internal Medicine

## 2020-07-07 ENCOUNTER — Other Ambulatory Visit: Payer: Self-pay | Admitting: Internal Medicine

## 2020-07-15 ENCOUNTER — Other Ambulatory Visit: Payer: Self-pay | Admitting: Internal Medicine

## 2020-08-10 ENCOUNTER — Other Ambulatory Visit: Payer: Self-pay | Admitting: Internal Medicine

## 2020-08-21 ENCOUNTER — Other Ambulatory Visit: Payer: Self-pay | Admitting: Internal Medicine

## 2020-09-19 ENCOUNTER — Other Ambulatory Visit: Payer: Self-pay | Admitting: Internal Medicine

## 2020-11-02 ENCOUNTER — Ambulatory Visit (INDEPENDENT_AMBULATORY_CARE_PROVIDER_SITE_OTHER): Payer: Managed Care, Other (non HMO) | Admitting: Internal Medicine

## 2020-11-02 ENCOUNTER — Encounter: Payer: Self-pay | Admitting: Internal Medicine

## 2020-11-02 ENCOUNTER — Other Ambulatory Visit: Payer: Self-pay

## 2020-11-02 VITALS — BP 134/86 | HR 60 | Temp 98.1°F | Ht 67.0 in | Wt 257.0 lb

## 2020-11-02 DIAGNOSIS — Z0000 Encounter for general adult medical examination without abnormal findings: Secondary | ICD-10-CM | POA: Diagnosis not present

## 2020-11-02 DIAGNOSIS — I10 Essential (primary) hypertension: Secondary | ICD-10-CM | POA: Diagnosis not present

## 2020-11-02 DIAGNOSIS — Z1159 Encounter for screening for other viral diseases: Secondary | ICD-10-CM | POA: Diagnosis not present

## 2020-11-02 DIAGNOSIS — R519 Headache, unspecified: Secondary | ICD-10-CM

## 2020-11-02 DIAGNOSIS — Z0001 Encounter for general adult medical examination with abnormal findings: Secondary | ICD-10-CM | POA: Diagnosis not present

## 2020-11-02 DIAGNOSIS — E559 Vitamin D deficiency, unspecified: Secondary | ICD-10-CM | POA: Diagnosis not present

## 2020-11-02 LAB — COMPREHENSIVE METABOLIC PANEL
ALT: 12 U/L (ref 0–35)
AST: 13 U/L (ref 0–37)
Albumin: 4.2 g/dL (ref 3.5–5.2)
Alkaline Phosphatase: 105 U/L (ref 39–117)
BUN: 15 mg/dL (ref 6–23)
CO2: 27 mEq/L (ref 19–32)
Calcium: 9.5 mg/dL (ref 8.4–10.5)
Chloride: 100 mEq/L (ref 96–112)
Creatinine, Ser: 0.8 mg/dL (ref 0.40–1.20)
GFR: 89.88 mL/min (ref >60.00)
Glucose, Bld: 95 mg/dL (ref 70–99)
Potassium: 4.6 mEq/L (ref 3.5–5.1)
Sodium: 134 mEq/L — ABNORMAL LOW (ref 135–145)
Total Bilirubin: 0.8 mg/dL (ref 0.2–1.2)
Total Protein: 7 g/dL (ref 6.0–8.3)

## 2020-11-02 LAB — LIPID PANEL
Cholesterol: 184 mg/dL (ref 0–200)
HDL: 45.3 mg/dL (ref >39.00)
LDL Cholesterol: 121 mg/dL — ABNORMAL HIGH (ref 0–99)
NonHDL: 138.96
Total CHOL/HDL Ratio: 4
Triglycerides: 88 mg/dL (ref 0.0–149.0)
VLDL: 17.6 mg/dL (ref 0.0–40.0)

## 2020-11-02 LAB — CBC
HCT: 43.5 % (ref 36.0–46.0)
Hemoglobin: 14.6 g/dL (ref 12.0–15.0)
MCHC: 33.6 g/dL (ref 30.0–36.0)
MCV: 91 fl (ref 78.0–100.0)
Platelets: 232 10*3/uL (ref 150.0–400.0)
RBC: 4.78 Mil/uL (ref 3.87–5.11)
RDW: 14.4 % (ref 11.5–15.5)
WBC: 9 10*3/uL (ref 4.0–10.5)

## 2020-11-02 LAB — VITAMIN D 25 HYDROXY (VIT D DEFICIENCY, FRACTURES): VITD: 19.81 ng/mL — ABNORMAL LOW (ref 30.00–100.00)

## 2020-11-02 LAB — HEMOGLOBIN A1C: Hgb A1c MFr Bld: 5.8 % (ref 4.6–6.5)

## 2020-11-02 MED ORDER — VITAMIN D (ERGOCALCIFEROL) 1.25 MG (50000 UNIT) PO CAPS: 50000.0000 [IU] | ORAL_CAPSULE | ORAL | 0 refills | Status: DC

## 2020-11-02 MED ORDER — AMITRIPTYLINE HCL 25 MG PO TABS: 25.0000 mg | ORAL_TABLET | Freq: Every day | ORAL | 3 refills | Status: DC

## 2020-11-02 NOTE — Patient Instructions (Signed)
Health Maintenance, Female Adopting a healthy lifestyle and getting preventive care are important in promoting health and wellness. Ask your health care provider about:  The right schedule for you to have regular tests and exams.  Things you can do on your own to prevent diseases and keep yourself healthy. What should I know about diet, weight, and exercise? Eat a healthy diet   Eat a diet that includes plenty of vegetables, fruits, low-fat dairy products, and lean protein.  Do not eat a lot of foods that are high in solid fats, added sugars, or sodium. Maintain a healthy weight Body mass index (BMI) is used to identify weight problems. It estimates body fat based on height and weight. Your health care provider can help determine your BMI and help you achieve or maintain a healthy weight. Get regular exercise Get regular exercise. This is one of the most important things you can do for your health. Most adults should:  Exercise for at least 150 minutes each week. The exercise should increase your heart rate and make you sweat (moderate-intensity exercise).  Do strengthening exercises at least twice a week. This is in addition to the moderate-intensity exercise.  Spend less time sitting. Even light physical activity can be beneficial. Watch cholesterol and blood lipids Have your blood tested for lipids and cholesterol at 44 years of age, then have this test every 5 years. Have your cholesterol levels checked more often if:  Your lipid or cholesterol levels are high.  You are older than 44 years of age.  You are at high risk for heart disease. What should I know about cancer screening? Depending on your health history and family history, you may need to have cancer screening at various ages. This may include screening for:  Breast cancer.  Cervical cancer.  Colorectal cancer.  Skin cancer.  Lung cancer. What should I know about heart disease, diabetes, and high blood  pressure? Blood pressure and heart disease  High blood pressure causes heart disease and increases the risk of stroke. This is more likely to develop in people who have high blood pressure readings, are of African descent, or are overweight.  Have your blood pressure checked: ? Every 3-5 years if you are 18-39 years of age. ? Every year if you are 40 years old or older. Diabetes Have regular diabetes screenings. This checks your fasting blood sugar level. Have the screening done:  Once every three years after age 40 if you are at a normal weight and have a low risk for diabetes.  More often and at a younger age if you are overweight or have a high risk for diabetes. What should I know about preventing infection? Hepatitis B If you have a higher risk for hepatitis B, you should be screened for this virus. Talk with your health care provider to find out if you are at risk for hepatitis B infection. Hepatitis C Testing is recommended for:  Everyone born from 1945 through 1965.  Anyone with known risk factors for hepatitis C. Sexually transmitted infections (STIs)  Get screened for STIs, including gonorrhea and chlamydia, if: ? You are sexually active and are younger than 44 years of age. ? You are older than 44 years of age and your health care provider tells you that you are at risk for this type of infection. ? Your sexual activity has changed since you were last screened, and you are at increased risk for chlamydia or gonorrhea. Ask your health care provider if   you are at risk.  Ask your health care provider about whether you are at high risk for HIV. Your health care provider may recommend a prescription medicine to help prevent HIV infection. If you choose to take medicine to prevent HIV, you should first get tested for HIV. You should then be tested every 3 months for as long as you are taking the medicine. Pregnancy  If you are about to stop having your period (premenopausal) and  you may become pregnant, seek counseling before you get pregnant.  Take 400 to 800 micrograms (mcg) of folic acid every day if you become pregnant.  Ask for birth control (contraception) if you want to prevent pregnancy. Osteoporosis and menopause Osteoporosis is a disease in which the bones lose minerals and strength with aging. This can result in bone fractures. If you are 65 years old or older, or if you are at risk for osteoporosis and fractures, ask your health care provider if you should:  Be screened for bone loss.  Take a calcium or vitamin D supplement to lower your risk of fractures.  Be given hormone replacement therapy (HRT) to treat symptoms of menopause. Follow these instructions at home: Lifestyle  Do not use any products that contain nicotine or tobacco, such as cigarettes, e-cigarettes, and chewing tobacco. If you need help quitting, ask your health care provider.  Do not use street drugs.  Do not share needles.  Ask your health care provider for help if you need support or information about quitting drugs. Alcohol use  Do not drink alcohol if: ? Your health care provider tells you not to drink. ? You are pregnant, may be pregnant, or are planning to become pregnant.  If you drink alcohol: ? Limit how much you use to 0-1 drink a day. ? Limit intake if you are breastfeeding.  Be aware of how much alcohol is in your drink. In the [place] one drink equals one 12 oz bottle of beer (355 mL), one 5 oz glass of wine (148 mL), or one 1 oz glass of hard liquor (44 mL). General instructions  Schedule regular health, dental, and eye exams.  Stay current with your vaccines.  Tell your health care provider if: ? You often feel depressed. ? You have ever been abused or do not feel safe at home. Summary  Adopting a healthy lifestyle and getting preventive care are important in promoting health and wellness.  Follow your health care provider's instructions about healthy  diet, exercising, and getting tested or screened for diseases.  Follow your health care provider's instructions on monitoring your cholesterol and blood pressure. This information is not intended to replace advice given to you by your health care provider. Make sure you discuss any questions you have with your health care provider. Document Revised: 11/20/2018 Document Reviewed: 11/20/2018 Elsevier Patient Education  2020 [employer]  

## 2020-11-02 NOTE — Assessment & Plan Note (Signed)
Borderline control on Labetalol Reinforced DASH diet and exercise for weight loss CMET today

## 2020-11-02 NOTE — Addendum Note (Signed)
Addended by: Jearld Fenton on: 11/02/2020 03:39 PM   Modules accepted: Orders

## 2020-11-02 NOTE — Progress Notes (Signed)
Subjective:    Patient ID: Claudia Glenn, female    DOB: April 08, 1976, 44 y.o.   MRN: [id number]  HPI  Pt presents to the clinic today for her annual exam. She is also due to follow up chronic conditions.  HTN: Her BP today is 134/86. She is taking Labetalol as prescribed. ECG from 09/2015 reviewed.  Frequent Headaches: Usually about 1 x week, usually around menses. Managed on Amitriptyline. She takes Ibuprofen as needed for breakthrough. She does not follow with neurology.  Flu: never Tetanus: 12/2009 Covid: never Mammogram: never Pap Smear: 09/2015 Vision Screening: annually Dentist: annually  Diet: She does not eat a lot of meat. She consumes some fruits and veggies daily. She does eat some fried foods. She drinks mostly Mt. Dew. Exercise: None  Review of Systems      Past Medical History:  Diagnosis Date  . Frequent headaches   . Hypertension     Current Outpatient Medications  Medication Sig Dispense Refill  . amitriptyline (ELAVIL) 25 MG tablet Take 1 tablet (25 mg total) by mouth at bedtime. MUST SCHEDULE PHYSICAL 30 tablet 0  . Ibuprofen 200 MG CAPS Take 400 mg by mouth as needed.    . labetalol (NORMODYNE) 100 MG tablet Take 1 tablet (100 mg total) by mouth 3 (three) times daily. 90 tablet 1   No current facility-administered medications for this visit.    Allergies  Allergen Reactions  . Losartan Other (See Comments)    Chest tightness  . Tramadol Other (See Comments)    dizziness    Family History  Problem Relation Age of Onset  . Irregular heart beat Mother   . Cancer Neg Hx   . Diabetes Neg Hx   . Heart disease Neg Hx   . Stroke Neg Hx     Social History   Socioeconomic History  . Marital status: Married    Spouse name: Not on file  . Number of children: Not on file  . Years of education: Not on file  . Highest education level: Not on file  Occupational History  . Not on file  Tobacco Use  . Smoking status: Former Smoker     Packs/day: 0.50    Types: Cigarettes  . Smokeless tobacco: Never Used  . Tobacco comment: quit 2014  Substance and Sexual Activity  . Alcohol use: Not Currently    Alcohol/week: 0.0 standard drinks    Comment: rare  . Drug use: No  . Sexual activity: Yes    Birth control/protection: None  Other Topics Concern  . Not on file  Social History Narrative  . Not on file   Social Determinants of Health   Financial Resource Strain:   . Difficulty of Paying Living Expenses: Not on file  Food Insecurity:   . Worried About Charity fundraiser in the Last Year: Not on file  . Ran Out of Food in the Last Year: Not on file  Transportation Needs:   . Lack of Transportation (Medical): Not on file  . Lack of Transportation (Non-Medical): Not on file  Physical Activity:   . Days of Exercise per Week: Not on file  . Minutes of Exercise per Session: Not on file  Stress:   . Feeling of Stress : Not on file  Social Connections:   . Frequency of Communication with Friends and Family: Not on file  . Frequency of Social Gatherings with Friends and Family: Not on file  . Attends Religious Services: Not on  file  . Active Member of Clubs or Organizations: Not on file  . Attends Archivist Meetings: Not on file  . Marital Status: Not on file  Intimate Partner Violence:   . Fear of Current or Ex-Partner: Not on file  . Emotionally Abused: Not on file  . Physically Abused: Not on file  . Sexually Abused: Not on file     Constitutional: Pt reports intermittent headaches. Denies fever, malaise, fatigue, or abrupt weight changes.  HEENT: Denies eye pain, eye redness, ear pain, ringing in the ears, wax buildup, runny nose, nasal congestion, bloody nose, or sore throat. Respiratory: Denies difficulty breathing, shortness of breath, cough or sputum production.   Cardiovascular: Denies chest pain, chest tightness, palpitations or swelling in the hands or feet.  Gastrointestinal: Denies  abdominal pain, bloating, constipation, diarrhea or blood in the stool.  GU: Denies urgency, frequency, pain with urination, burning sensation, blood in urine, odor or discharge. Musculoskeletal: Denies decrease in range of motion, difficulty with gait, muscle pain or joint pain and swelling.  Skin: Denies redness, rashes, lesions or ulcercations.  Neurological: Denies dizziness, difficulty with memory, difficulty with speech or problems with balance and coordination.  Psych: Denies anxiety, depression, SI/HI.  No other specific complaints in a complete review of systems (except as listed in HPI above).  Objective:   Physical Exam  BP 134/86   Pulse 60   Temp 98.1 F (36.7 C) (Temporal)   Ht 5' 7"  (1.702 m)   Wt 257 lb (116.6 kg)   LMP 10/16/2020   SpO2 98%   BMI 40.25 kg/m   Wt Readings from Last 3 Encounters:  12/24/19 258 lb (117 kg)  12/20/19 250 lb (113.4 kg)  10/07/18 249 lb (112.9 kg)    General: Appears her stated age, obese, in NAD. Skin: Warm, dry and intact. No rashes, lesions or ulcerations noted. HEENT: Head: normal shape and size; Eyes: sclera white, no icterus, conjunctiva pink, PERRLA and EOMs intact;  Neck:  Neck supple, trachea midline. No masses, lumps or thyromegaly present.  Cardiovascular: Normal rate and rhythm. S1,S2 noted.  No murmur, rubs or gallops noted. No JVD or BLE edema.  Pulmonary/Chest: Normal effort and positive vesicular breath sounds. No respiratory distress. No wheezes, rales or ronchi noted.  Abdomen: Soft and nontender. Normal bowel sounds. No distention or masses noted. Liver, spleen and kidneys non palpable. Musculoskeletal: Strength 5/5 BUE/BLE. No difficulty with gait.  Neurological: Alert and oriented. Cranial nerves II-XII grossly intact. Coordination normal.  Psychiatric: Mood and affect normal. Behavior is normal. Judgment and thought content normal.   BMET    Component Value Date/Time   NA 135 10/07/2018 1432   K 4.8  10/07/2018 1432   CL 100 10/07/2018 1432   CO2 30 10/07/2018 1432   GLUCOSE 93 10/07/2018 1432   BUN 11 10/07/2018 1432   CREATININE 0.79 10/07/2018 1432   CALCIUM 9.5 10/07/2018 1432   GFRNONAA >60 10/02/2015 1212   GFRAA >60 10/02/2015 1212    Lipid Panel     Component Value Date/Time   CHOL 214 (H) 10/07/2018 1432   TRIG 150.0 (H) 10/07/2018 1432   HDL 50.20 10/07/2018 1432   CHOLHDL 4 10/07/2018 1432   VLDL 30.0 10/07/2018 1432   LDLCALC 134 (H) 10/07/2018 1432    CBC    Component Value Date/Time   WBC 10.0 10/07/2018 1432   RBC 4.81 10/07/2018 1432   HGB 15.1 (H) 10/07/2018 1432   HCT 44.9 10/07/2018 1432  PLT 246.0 10/07/2018 1432   MCV 93.2 10/07/2018 1432   MCH 30.5 10/02/2015 1212   MCHC 33.6 10/07/2018 1432   RDW 14.2 10/07/2018 1432    Hgb A1C Lab Results  Component Value Date   HGBA1C 5.7 06/27/2017            Assessment & Plan:   Preventative Health Maintenance:  She declines flu shot She declines tetanus shot Encouraged her to get a Covid vaccine She declines pap smear today, will schedule with GYN Will start screening mammograms at 53 Will start colon cancer screening at 61 Encouraged her to consume a balanced diet and exercise regimen Advised her to see an eye doctor and dentist annually Will check CBC, CMET, Lipid, A1C and Vit D today  RTC in 1 year, sooner if needed Webb Silversmith, NP  This visit occurred during the SARS-CoV-2 public health emergency.  Safety protocols were in place, including screening questions prior to the visit, additional usage of staff PPE, and extensive cleaning of exam room while observing appropriate contact time as indicated for disinfecting solutions.

## 2020-11-02 NOTE — Assessment & Plan Note (Signed)
Continue Amitriptyline, refilled today Will monitor

## 2020-11-05 LAB — HEPATITIS C ANTIBODY
Hepatitis C Ab: NONREACTIVE
SIGNAL TO CUT-OFF: 0.01 (ref <1.00)

## 2021-01-05 ENCOUNTER — Other Ambulatory Visit: Payer: Self-pay | Admitting: Internal Medicine

## 2021-01-10 ENCOUNTER — Telehealth: Payer: Self-pay

## 2021-01-10 NOTE — Telephone Encounter (Signed)
Pt scheduled for appt on 2/1

## 2021-01-10 NOTE — Telephone Encounter (Signed)
[place] Night - Client Nonclinical Telephone Record [facility]urse Client [place] [facility] Night - Client Client Site [place] Physician Webb Silversmith - NP Contact Type Call Who Is Calling Patient / Member / Family / Caregiver Caller Name [place] Phone Number [phone] Patient Name Claudia Glenn Patient DOB Jan 03, 1976 Call Type Message Only Information Provided Reason for Call Request to Schedule Office Appointment Initial Comment Caller states she wanted to schedule an appointment. Caller states she had headaches for a week and neck pain. Additional Comment Caller declined triage and office hours was provided. Disp. Time Disposition Final User 01/10/2021 7:49:55 AM General Information Provided Yes Josephine Cables Call Closed By: Josephine Cables Transaction Date/Time: 01/10/2021 7:46:31 AM (ET)

## 2021-01-11 ENCOUNTER — Other Ambulatory Visit: Payer: Self-pay

## 2021-01-11 ENCOUNTER — Telehealth: Payer: Self-pay | Admitting: *Deleted

## 2021-01-11 ENCOUNTER — Encounter: Payer: Self-pay | Admitting: Internal Medicine

## 2021-01-11 ENCOUNTER — Ambulatory Visit (INDEPENDENT_AMBULATORY_CARE_PROVIDER_SITE_OTHER): Payer: Managed Care, Other (non HMO) | Admitting: Internal Medicine

## 2021-01-11 VITALS — BP 150/94 | HR 68 | Temp 97.1°F | Wt 258.0 lb

## 2021-01-11 DIAGNOSIS — I10 Essential (primary) hypertension: Secondary | ICD-10-CM

## 2021-01-11 DIAGNOSIS — G8929 Other chronic pain: Secondary | ICD-10-CM | POA: Diagnosis not present

## 2021-01-11 DIAGNOSIS — G4486 Cervicogenic headache: Secondary | ICD-10-CM | POA: Diagnosis not present

## 2021-01-11 DIAGNOSIS — M542 Cervicalgia: Secondary | ICD-10-CM | POA: Diagnosis not present

## 2021-01-11 MED ORDER — DEXAMETHASONE SODIUM PHOSPHATE 10 MG/ML IJ SOLN
10.0000 mg | Freq: Once | INTRAMUSCULAR | Status: AC
  Administered 2021-01-11: 10 mg via INTRAMUSCULAR

## 2021-01-11 MED ORDER — CYCLOBENZAPRINE HCL 10 MG PO TABS: 10.0000 mg | ORAL_TABLET | Freq: Three times a day (TID) | ORAL | 0 refills | Status: DC | PRN

## 2021-01-11 NOTE — Telephone Encounter (Signed)
  Claudia Glenn- I need her to get an open MRI. Is this an option?  No BP meds yet. Continue Labetalol. I will see her in a week to 10 days for BP followup

## 2021-01-11 NOTE — Telephone Encounter (Signed)
Patient left a voicemail stating that she just saw Webb Silversmith NP today. Patient stated that a MRI was ordered and she has found out that the machine is not an open MRI machine. Patient stated that she is claustrophobic and can not do this. Patient wants to know if there is another option. Patient also wants to know when her blood pressure medication is going to be sent in.

## 2021-01-11 NOTE — Telephone Encounter (Signed)
Precert is pending approval - will have to change location once approved as [facility] does not offer open MRI per Anastasiya's notes below

## 2021-01-11 NOTE — Progress Notes (Signed)
Subjective:    Patient ID: Claudia Glenn, female    DOB: 25-May-1976, 45 y.o.   MRN: [id number]  HPI  Patient presents the clinic today with complaint of headaches and neck pain. This started years ago but has been worse in the last 2 days. The headaches are located at the base of her skull. She describes the pain as sharp and stabbing. The pain radiates into her neck and upper back. The pain is worse with movement. She had been taking Amitriptyline as prescribed but stopped taking it the last few days because her pain was so bad. She reports some numbness and tingling in her right hand but not in her right arm. She has also tried Ibuprofen and leftover muscle relaxers with good relief. She had a normal head CT 12/2019. CT cervical spine at that time showed:   1. No fracture or spondylolisthesis. Slight lower cervical dextroscoliosis.  2. Osteoarthritic change at several levels. Moderate exit foraminal narrowing on the left at C5-6 due to bony hypertrophy. A slightly lesser degree of exit foraminal narrowing is noted at C4-5 on the left due to bony hypertrophy. No frank disc extrusion or stenosis.  She was scheduled for a MRI but was not able to do this due to claustrophobia.  Of note, her BP today is 150/94. She is taking Labetalol as prescribed.  Review of Systems  Past Medical History:  Diagnosis Date  . Frequent headaches   . Hypertension     Current Outpatient Medications  Medication Sig Dispense Refill  . amitriptyline (ELAVIL) 25 MG tablet Take 1 tablet (25 mg total) by mouth at bedtime. 90 tablet 3  . Ibuprofen 200 MG CAPS Take 400 mg by mouth as needed.    . labetalol (NORMODYNE) 100 MG tablet TAKE ONE TABLET BY MOUTH THREE TIMES A DAY 90 tablet 2  . Vitamin D, Ergocalciferol, (DRISDOL) 1.25 MG (50000 UNIT) CAPS capsule Take 1 capsule (50,000 Units total) by mouth every 7 (seven) days. 12 capsule 0   No current facility-administered medications for this visit.     Allergies  Allergen Reactions  . Losartan Other (See Comments)    Chest tightness  . Tramadol Other (See Comments)    dizziness    Family History  Problem Relation Age of Onset  . Irregular heart beat Mother   . Cancer Neg Hx   . Diabetes Neg Hx   . Heart disease Neg Hx   . Stroke Neg Hx     Social History   Socioeconomic History  . Marital status: Married    Spouse name: Not on file  . Number of children: Not on file  . Years of education: Not on file  . Highest education level: Not on file  Occupational History  . Not on file  Tobacco Use  . Smoking status: Former Smoker    Packs/day: 0.50    Types: Cigarettes  . Smokeless tobacco: Never Used  . Tobacco comment: quit 2014  Substance and Sexual Activity  . Alcohol use: Not Currently    Alcohol/week: 0.0 standard drinks    Comment: rare  . Drug use: No  . Sexual activity: Yes    Birth control/protection: None  Other Topics Concern  . Not on file  Social History Narrative  . Not on file   Social Determinants of Health   Financial Resource Strain: Not on file  Food Insecurity: Not on file  Transportation Needs: Not on file  Physical Activity: Not on file  Stress: Not on file  Social Connections: Not on file  Intimate Partner Violence: Not on file     Constitutional: Patient reports headaches.  Denies fever, malaise, fatigue, or abrupt weight changes.  HEENT: Denies eye pain, eye redness, ear pain, ringing in the ears, wax buildup, runny nose, nasal congestion, bloody nose, or sore throat. Respiratory: Denies difficulty breathing, shortness of breath, cough or sputum production.   Cardiovascular: Denies chest pain, chest tightness, palpitations or swelling in the hands or feet.  Musculoskeletal: Patient reports neck pain.  Denies decrease in range of motion, difficulty with gait, or joint  swelling. .  Neurological: Denies dizziness, difficulty with memory, difficulty with speech or problems with  balance and coordination.    No other specific complaints in a complete review of systems (except as listed in HPI above).     Objective:   Physical Exam   BP (!) 150/94   Pulse 68   Temp (!) 97.1 F (36.2 C) (Temporal)   Wt 258 lb (117 kg)   SpO2 98%   BMI 40.41 kg/m   Wt Readings from Last 3 Encounters:  11/02/20 257 lb (116.6 kg)  12/24/19 258 lb (117 kg)  12/20/19 250 lb (113.4 kg)    General: Appears her stated age, obese, in NAD. Skin: Warm, dry and intact. No rashes noted. HEENT: Head: normal shape and size; Eyes: sclera white, no icterus, conjunctiva pink, PERRLA and EOMs intact;  Cardiovascular: Normal rate. Pulmonary/Chest: Normal effort. Musculoskeletal: Normal flexion, extension, rotation and lateral bending of the cervical spine. Bony tenderness noted over the cervical spine. Shoulder shrug equal. Strength 5/5 BUE, hand grips equal. Neurological: Alert and oriented. Negative Tinel's. Negative Phalen's.  Coordination normal.    BMET    Component Value Date/Time   NA 134 (L) 11/02/2020 1144   K 4.6 11/02/2020 1144   CL 100 11/02/2020 1144   CO2 27 11/02/2020 1144   GLUCOSE 95 11/02/2020 1144   BUN 15 11/02/2020 1144   CREATININE 0.80 11/02/2020 1144   CALCIUM 9.5 11/02/2020 1144   GFRNONAA >60 10/02/2015 1212   GFRAA >60 10/02/2015 1212    Lipid Panel     Component Value Date/Time   CHOL 184 11/02/2020 1144   TRIG 88.0 11/02/2020 1144   HDL 45.30 11/02/2020 1144   CHOLHDL 4 11/02/2020 1144   VLDL 17.6 11/02/2020 1144   LDLCALC 121 (H) 11/02/2020 1144    CBC    Component Value Date/Time   WBC 9.0 11/02/2020 1144   RBC 4.78 11/02/2020 1144   HGB 14.6 11/02/2020 1144   HCT 43.5 11/02/2020 1144   PLT 232.0 11/02/2020 1144   MCV 91.0 11/02/2020 1144   MCH 30.5 10/02/2015 1212   MCHC 33.6 11/02/2020 1144   RDW 14.4 11/02/2020 1144    Hgb A1C Lab Results  Component Value Date   HGBA1C 5.8 11/02/2020           Assessment & Plan:    Chronic Neck Pain, Cervicogenic Headache:  Decadron 10 mg IM x 1 RX for Flexeril 10 mg TID prn- sedation caution given Encouraged heat and massage Will order open MRI cervical spine  HTN:  ? If elevated due to pain Will try to treat pain, have her come back in 2 weeks for BP check If remains elevated, would recommend Amlodipine in addition to Labetalol Reinforced DASH diet and exercise for weight loss  Will follow up after imaging, return precautions discussed Webb Silversmith, NP This visit occurred during the  SARS-CoV-2 public health emergency.  Safety protocols were in place, including screening questions prior to the visit, additional usage of staff PPE, and extensive cleaning of exam room while observing appropriate contact time as indicated for disinfecting solutions.

## 2021-01-11 NOTE — Addendum Note (Signed)
Addended by: Lurlean Nanny on: 01/11/2021 05:37 PM   Modules accepted: Orders

## 2021-01-11 NOTE — Telephone Encounter (Signed)
Will work on this with [employer] [facility]. Patient advised in regards to the medication and scheduled for follow up on 01/20/21. Order was put in today. [facility] does not have an open MRI

## 2021-01-11 NOTE — Patient Instructions (Signed)

## 2021-01-12 ENCOUNTER — Telehealth: Payer: Self-pay | Admitting: Internal Medicine

## 2021-01-12 NOTE — Telephone Encounter (Signed)
Pt decline to get sch covid vaccine

## 2021-01-13 NOTE — Telephone Encounter (Signed)
MRI still pending review with [facility] as of today 01/13/21

## 2021-01-14 NOTE — Telephone Encounter (Addendum)
MRI cervical spine denied by [facility] - please see below.   Based on [facility] Guidelines Section(s): SP 3.1 Neck (Cervical Spine) Pain without and with Neurological Features (Including Stenosis) and 1.0 General Guidelines, we cannot approve this request. Your records show that you have a problem with your neck. The request cannot be approved because:   Imaging requires six weeks of provider directed treatment to be completed. Supported treatments include (but are not limited to) drugs for swelling or pain, an in office workout (physical therapy), and/or oral or injected steroids. This must have been completed in the past three months without improved symptoms. Contact (via office visit, phone, email, or messaging) must occur after the treatment is completed. This has not been met because:   -You have not completed six weeks of provider directed treatment.   -The provider directed treatment did not occur within the last three months.   -There was no contact with your provider after completing treatment.

## 2021-01-14 NOTE — Telephone Encounter (Signed)
MRI denied. Needs 6 weeks PT, is she agreeable. [place] or [place]?

## 2021-01-19 NOTE — Telephone Encounter (Signed)
Pt declined PT as it costs $90-100 per visit per her insurance.Marland KitchenMarland Kitchen

## 2021-01-20 ENCOUNTER — Ambulatory Visit: Payer: Managed Care, Other (non HMO) | Admitting: Internal Medicine

## 2021-01-24 ENCOUNTER — Ambulatory Visit (INDEPENDENT_AMBULATORY_CARE_PROVIDER_SITE_OTHER): Payer: Managed Care, Other (non HMO) | Admitting: Internal Medicine

## 2021-01-24 ENCOUNTER — Other Ambulatory Visit: Payer: Self-pay

## 2021-01-24 ENCOUNTER — Encounter: Payer: Self-pay | Admitting: Internal Medicine

## 2021-01-24 DIAGNOSIS — I10 Essential (primary) hypertension: Secondary | ICD-10-CM | POA: Diagnosis not present

## 2021-01-24 MED ORDER — AMLODIPINE BESYLATE 10 MG PO TABS: 10.0000 mg | ORAL_TABLET | Freq: Every day | ORAL | 0 refills | Status: DC

## 2021-01-24 NOTE — Patient Instructions (Signed)

## 2021-01-24 NOTE — Progress Notes (Signed)
Subjective:    Patient ID: Claudia Glenn, female    DOB: 1976/10/30, 45 y.o.   MRN: [id number]  HPI  Patient presents the clinic today for follow-up of HTN.  Her blood pressures have been elevated.  We thought this could be secondary to chronic neck pain and cervicogenic headaches.  We treated her pain with Decadron IM and Flexeril as needed with significant improvement in her pain.  MRI has been ordered but was not authorized by her insurance.  She is taking Labetalol 100 mg 3 times daily as prescribed.  Her BP today is 152/92.  ECG from 09/2015 reviewed.  Review of Systems      Past Medical History:  Diagnosis Date  . Frequent headaches   . Hypertension     Current Outpatient Medications  Medication Sig Dispense Refill  . amitriptyline (ELAVIL) 25 MG tablet Take 1 tablet (25 mg total) by mouth at bedtime. 90 tablet 3  . cyclobenzaprine (FLEXERIL) 10 MG tablet Take 1 tablet (10 mg total) by mouth 3 (three) times daily as needed for muscle spasms. 30 tablet 0  . Ibuprofen 200 MG CAPS Take 400 mg by mouth as needed.    . labetalol (NORMODYNE) 100 MG tablet TAKE ONE TABLET BY MOUTH THREE TIMES A DAY 90 tablet 2  . Vitamin D, Ergocalciferol, (DRISDOL) 1.25 MG (50000 UNIT) CAPS capsule Take 1 capsule (50,000 Units total) by mouth every 7 (seven) days. 12 capsule 0   No current facility-administered medications for this visit.    Allergies  Allergen Reactions  . Losartan Other (See Comments)    Chest tightness  . Tramadol Other (See Comments)    dizziness    Family History  Problem Relation Age of Onset  . Irregular heart beat Mother   . Cancer Neg Hx   . Diabetes Neg Hx   . Heart disease Neg Hx   . Stroke Neg Hx     Social History   Socioeconomic History  . Marital status: Married    Spouse name: Not on file  . Number of children: Not on file  . Years of education: Not on file  . Highest education level: Not on file  Occupational History  . Not on file  Tobacco  Use  . Smoking status: Former Smoker    Packs/day: 0.50    Types: Cigarettes  . Smokeless tobacco: Never Used  . Tobacco comment: quit 2014  Substance and Sexual Activity  . Alcohol use: Not Currently    Alcohol/week: 0.0 standard drinks    Comment: rare  . Drug use: No  . Sexual activity: Yes    Birth control/protection: None  Other Topics Concern  . Not on file  Social History Narrative  . Not on file   Social Determinants of Health   Financial Resource Strain: Not on file  Food Insecurity: Not on file  Transportation Needs: Not on file  Physical Activity: Not on file  Stress: Not on file  Social Connections: Not on file  Intimate Partner Violence: Not on file     Constitutional: Denies fever, malaise, fatigue, headache or abrupt weight changes.  Respiratory: Denies difficulty breathing, shortness of breath, cough or sputum production.   Cardiovascular: Denies chest pain, chest tightness, palpitations or swelling in the hands or feet.  Musculoskeletal: Pt reports neck discomfort. Denies decrease in range of motion, difficulty with gait, muscle pain or joint swelling.  Skin: Denies redness, rashes, lesions or ulcercations.  Neurological: Denies dizziness, difficulty with memory,  difficulty with speech or problems with balance and coordination.    No other specific complaints in a complete review of systems (except as listed in HPI above).  Objective:   Physical Exam   BP (!) 152/92   Pulse 93   Temp 97.6 F (36.4 C) (Temporal)   Wt 261 lb (118.4 kg)   SpO2 98%   BMI 40.88 kg/m   Wt Readings from Last 3 Encounters:  01/11/21 258 lb (117 kg)  11/02/20 257 lb (116.6 kg)  12/24/19 258 lb (117 kg)    General: Appears her stated age, obese, in NAD. HEENT: Head: normal shape and size; Eyes: sclera white, no icterus, PERRLA and EOMs intact;  Cardiovascular: Normal rate. Pulmonary/Chest: Normal effort. Neurological: Alert and oriented.    BMET    Component  Value Date/Time   NA 134 (L) 11/02/2020 1144   K 4.6 11/02/2020 1144   CL 100 11/02/2020 1144   CO2 27 11/02/2020 1144   GLUCOSE 95 11/02/2020 1144   BUN 15 11/02/2020 1144   CREATININE 0.80 11/02/2020 1144   CALCIUM 9.5 11/02/2020 1144   GFRNONAA >60 10/02/2015 1212   GFRAA >60 10/02/2015 1212    Lipid Panel     Component Value Date/Time   CHOL 184 11/02/2020 1144   TRIG 88.0 11/02/2020 1144   HDL 45.30 11/02/2020 1144   CHOLHDL 4 11/02/2020 1144   VLDL 17.6 11/02/2020 1144   LDLCALC 121 (H) 11/02/2020 1144    CBC    Component Value Date/Time   WBC 9.0 11/02/2020 1144   RBC 4.78 11/02/2020 1144   HGB 14.6 11/02/2020 1144   HCT 43.5 11/02/2020 1144   PLT 232.0 11/02/2020 1144   MCV 91.0 11/02/2020 1144   MCH 30.5 10/02/2015 1212   MCHC 33.6 11/02/2020 1144   RDW 14.4 11/02/2020 1144    Hgb A1C Lab Results  Component Value Date   HGBA1C 5.8 11/02/2020           Assessment & Plan:    Webb Silversmith, NP This visit occurred during the SARS-CoV-2 public health emergency.  Safety protocols were in place, including screening questions prior to the visit, additional usage of staff PPE, and extensive cleaning of exam room while observing appropriate contact time as indicated for disinfecting solutions.

## 2021-01-24 NOTE — Assessment & Plan Note (Signed)
Will add Amlodipine 10 mg daily in addition to her Labetalol Reinforced DASH diet and exercise for weight loss  RTC in 2 weeks for BP check

## 2021-01-25 ENCOUNTER — Ambulatory Visit: Payer: Managed Care, Other (non HMO) | Admitting: Internal Medicine

## 2021-01-28 ENCOUNTER — Other Ambulatory Visit: Payer: Self-pay | Admitting: Internal Medicine

## 2021-02-02 ENCOUNTER — Ambulatory Visit: Payer: Managed Care, Other (non HMO) | Admitting: Internal Medicine

## 2021-02-10 ENCOUNTER — Encounter: Payer: Self-pay | Admitting: Internal Medicine

## 2021-02-10 ENCOUNTER — Ambulatory Visit (INDEPENDENT_AMBULATORY_CARE_PROVIDER_SITE_OTHER): Payer: Managed Care, Other (non HMO) | Admitting: Internal Medicine

## 2021-02-10 ENCOUNTER — Other Ambulatory Visit: Payer: Self-pay

## 2021-02-10 DIAGNOSIS — M542 Cervicalgia: Secondary | ICD-10-CM

## 2021-02-10 DIAGNOSIS — R519 Headache, unspecified: Secondary | ICD-10-CM

## 2021-02-10 DIAGNOSIS — G8929 Other chronic pain: Secondary | ICD-10-CM | POA: Diagnosis not present

## 2021-02-10 DIAGNOSIS — I10 Essential (primary) hypertension: Secondary | ICD-10-CM | POA: Diagnosis not present

## 2021-02-10 NOTE — Assessment & Plan Note (Signed)
Increase Amitriptyline to 50 mg QHS

## 2021-02-10 NOTE — Assessment & Plan Note (Signed)
Continue Labetalol and Amlodipine Reinforced DASH diet and exercise for weight loss Consider adding Lisinopril pending BP check in 2 weeks

## 2021-02-10 NOTE — Patient Instructions (Signed)

## 2021-02-10 NOTE — Progress Notes (Signed)
Subjective:    Patient ID: Claudia Glenn, female    DOB: 07-10-1976, 45 y.o.   MRN: [id number]  HPI  Pt presents to the clinic today for 2 week follow up of HTN. At her last visit, Amlodipine was added in addition to her Labetalol. She has been taking the medication as prescribed and denies adverse side effects. Her BP today is 144/84. ECG from 09/2015 reviewed.  She continues to have persistent neck pain and headaches. She is taking Amitriptyline, Ibuprofen and Flexeril as prescribed. She has been unable to get the MRI of her cervical spine because she is unable to tolerate enlcosure of the MRI machine.  Review of Systems  Past Medical History:  Diagnosis Date  . Frequent headaches   . Hypertension     Current Outpatient Medications  Medication Sig Dispense Refill  . amitriptyline (ELAVIL) 25 MG tablet Take 1 tablet (25 mg total) by mouth at bedtime. 90 tablet 3  . amLODipine (NORVASC) 10 MG tablet Take 1 tablet (10 mg total) by mouth daily. 30 tablet 0  . cyclobenzaprine (FLEXERIL) 10 MG tablet Take 1 tablet (10 mg total) by mouth 3 (three) times daily as needed for muscle spasms. 30 tablet 0  . Ibuprofen 200 MG CAPS Take 400 mg by mouth as needed.    . labetalol (NORMODYNE) 100 MG tablet TAKE ONE TABLET BY MOUTH THREE TIMES A DAY 90 tablet 2   No current facility-administered medications for this visit.    Allergies  Allergen Reactions  . Losartan Other (See Comments)    Chest tightness  . Tramadol Other (See Comments)    dizziness    Family History  Problem Relation Age of Onset  . Irregular heart beat Mother   . Cancer Neg Hx   . Diabetes Neg Hx   . Heart disease Neg Hx   . Stroke Neg Hx     Social History   Socioeconomic History  . Marital status: Married    Spouse name: Not on file  . Number of children: Not on file  . Years of education: Not on file  . Highest education level: Not on file  Occupational History  . Not on file  Tobacco Use  . Smoking  status: Former Smoker    Packs/day: 0.50    Types: Cigarettes  . Smokeless tobacco: Never Used  . Tobacco comment: quit 2014  Substance and Sexual Activity  . Alcohol use: Not Currently    Alcohol/week: 0.0 standard drinks    Comment: rare  . Drug use: No  . Sexual activity: Yes    Birth control/protection: None  Other Topics Concern  . Not on file  Social History Narrative  . Not on file   Social Determinants of Health   Financial Resource Strain: Not on file  Food Insecurity: Not on file  Transportation Needs: Not on file  Physical Activity: Not on file  Stress: Not on file  Social Connections: Not on file  Intimate Partner Violence: Not on file     Constitutional: Pt reports frequent headaches. Denies fever, malaise, fatigue, or abrupt weight changes.  Respiratory: Denies difficulty breathing, shortness of breath, cough or sputum production.   Cardiovascular: Denies chest pain, chest tightness, palpitations or swelling in the hands or feet.  Musculoskeletal: Pt reports neck pain. Denies decrease in range of motion, difficulty with gait, or joint swelling.    No other specific complaints in a complete review of systems (except as listed in HPI above).  Objective:   Physical Exam   BP (!) 144/84   Temp 97.6 F (36.4 C) (Temporal)   Wt 259 lb (117.5 kg)   SpO2 98%   BMI 40.57 kg/m   Wt Readings from Last 3 Encounters:  01/24/21 261 lb (118.4 kg)  01/11/21 258 lb (117 kg)  11/02/20 257 lb (116.6 kg)    General: Appears her stated age, obese in NAD. Cardiovascular: Normal rate. Pulmonary/Chest: Normal effort. Musculoskeletal:  No difficulty with gait.  Neurological: Alert and oriented.  BMET    Component Value Date/Time   NA 134 (L) 11/02/2020 1144   K 4.6 11/02/2020 1144   CL 100 11/02/2020 1144   CO2 27 11/02/2020 1144   GLUCOSE 95 11/02/2020 1144   BUN 15 11/02/2020 1144   CREATININE 0.80 11/02/2020 1144   CALCIUM 9.5 11/02/2020 1144    GFRNONAA >60 10/02/2015 1212   GFRAA >60 10/02/2015 1212    Lipid Panel     Component Value Date/Time   CHOL 184 11/02/2020 1144   TRIG 88.0 11/02/2020 1144   HDL 45.30 11/02/2020 1144   CHOLHDL 4 11/02/2020 1144   VLDL 17.6 11/02/2020 1144   LDLCALC 121 (H) 11/02/2020 1144    CBC    Component Value Date/Time   WBC 9.0 11/02/2020 1144   RBC 4.78 11/02/2020 1144   HGB 14.6 11/02/2020 1144   HCT 43.5 11/02/2020 1144   PLT 232.0 11/02/2020 1144   MCV 91.0 11/02/2020 1144   MCH 30.5 10/02/2015 1212   MCHC 33.6 11/02/2020 1144   RDW 14.4 11/02/2020 1144    Hgb A1C Lab Results  Component Value Date   HGBA1C 5.8 11/02/2020           Assessment & Plan:    Webb Silversmith, NP This visit occurred during the SARS-CoV-2 public health emergency.  Safety protocols were in place, including screening questions prior to the visit, additional usage of staff PPE, and extensive cleaning of exam room while observing appropriate contact time as indicated for disinfecting solutions.

## 2021-02-15 ENCOUNTER — Telehealth: Payer: Self-pay | Admitting: Internal Medicine

## 2021-02-15 MED ORDER — AMLODIPINE BESYLATE 10 MG PO TABS: 10.0000 mg | ORAL_TABLET | Freq: Every day | ORAL | 0 refills | Status: DC

## 2021-02-15 NOTE — Telephone Encounter (Signed)
  LAST APPOINTMENT DATE: 02/10/2021   NEXT APPOINTMENT DATE: 02/25/2021  MEDICATION: Amlodipine  [facility] [address]  Pt is not out quite yet but will not have enough to cover until next appointment.  Let patient know to contact pharmacy at the end of the day to make sure medication is ready.  Please notify patient to allow 48-72 hours to process  Encourage patient to contact the pharmacy for refills or they can request refills through [facility]:   LAST REFILL:  QTY:  REFILL DATE:    OTHER COMMENTS:    Okay for refill?  Please advise

## 2021-02-25 ENCOUNTER — Ambulatory Visit: Payer: Managed Care, Other (non HMO)

## 2021-02-25 ENCOUNTER — Other Ambulatory Visit: Payer: Self-pay

## 2021-02-25 VITALS — BP 132/82 | HR 69

## 2021-02-25 DIAGNOSIS — I10 Essential (primary) hypertension: Secondary | ICD-10-CM

## 2021-02-25 NOTE — Progress Notes (Signed)
Pt presented to the office to have blood pressure checked... pt reports she has been experiencing burning sensation in legs intermittent...  BP 132 82 HR  69

## 2021-03-14 ENCOUNTER — Other Ambulatory Visit: Payer: Self-pay | Admitting: Internal Medicine

## 2021-03-30 ENCOUNTER — Telehealth: Payer: Self-pay

## 2021-03-30 ENCOUNTER — Telehealth: Payer: Managed Care, Other (non HMO) | Admitting: Physician Assistant

## 2021-03-30 DIAGNOSIS — J019 Acute sinusitis, unspecified: Secondary | ICD-10-CM

## 2021-03-30 DIAGNOSIS — B9789 Other viral agents as the cause of diseases classified elsewhere: Secondary | ICD-10-CM | POA: Diagnosis not present

## 2021-03-30 MED ORDER — IPRATROPIUM BROMIDE 0.03 % NA SOLN: 2.0000 | Freq: Two times a day (BID) | NASAL | 0 refills | Status: DC

## 2021-03-30 NOTE — Telephone Encounter (Signed)
Pt said she has been sick since the past weekend with fever, chills,sinus congestion and cough. Pt said she is wanting to go where ever R Baity NP is going to practice. I gave pt [facility] info and [phone]. I advised would be glad to set up virtual visit for pt but she said she would contact [facility]. Sending note to Avie Echevaria NP.

## 2021-03-30 NOTE — Progress Notes (Signed)
I have spent 5 minutes in review of e-visit questionnaire, review and updating patient chart, medical decision making and response to patient.   Zema Lizardo Cody Trianna Lupien, PA-C    

## 2021-03-30 NOTE — Progress Notes (Signed)
We are sorry that you are not feeling well.  Here is how we plan to help!  Based on what you have shared with me it looks like you have sinusitis.  Sinusitis is inflammation and infection in the sinus cavities of the head.  Based on your presentation I believe you most likely have Acute Viral Sinusitis.This is an infection most likely caused by a virus. There is not specific treatment for viral sinusitis other than to help you with the symptoms until the infection runs its course.  You may use an oral decongestant such as Mucinex D or if you have glaucoma or high blood pressure use plain Mucinex. Saline nasal spray help and can safely be used as often as needed for congestion, I have prescribed: Ipratropium Bromide nasal spray 0.03% 2 sprays in eah nostril 2-3 times a day  Some authorities believe that zinc sprays or the use of Echinacea may shorten the course of your symptoms.  Sinus infections are not as easily transmitted as other respiratory infection, however we still recommend that you avoid close contact with loved ones, especially the very young and elderly.  Remember to wash your hands thoroughly throughout the day as this is the number one way to prevent the spread of infection!  Home Care:  Only take medications as instructed by your medical team.  Do not take these medications with alcohol.  A steam or ultrasonic humidifier can help congestion.  You can place a towel over your head and breathe in the steam from hot water coming from a faucet.  Avoid close contacts especially the very young and the elderly.  Cover your mouth when you cough or sneeze.  Always remember to wash your hands.  Get Help Right Away If:  You develop worsening fever or sinus pain.  You develop a severe head ache or visual changes.  Your symptoms persist after you have completed your treatment plan.  Make sure you  Understand these instructions.  Will watch your condition.  Will get help right  away if you are not doing well or get worse.  Your e-visit answers were reviewed by a board certified advanced clinical practitioner to complete your personal care plan.  Depending on the condition, your plan could have included both over the counter or prescription medications.  If there is a problem please reply  once you have received a response from your provider.  Your safety is important to [place].  If you have drug allergies check your prescription carefully.    You can use MyChart to ask questions about today's visit, request a non-urgent call back, or ask for a work or school excuse for 24 hours related to this e-Visit. If it has been greater than 24 hours you will need to follow up with your provider, or enter a new e-Visit to address those concerns.  You will get an e-mail in the next two days asking about your experience.  I hope that your e-visit has been valuable and will speed your recovery. Thank you for using e-visits.

## 2021-03-31 NOTE — Telephone Encounter (Signed)
Noted  

## 2021-04-20 ENCOUNTER — Other Ambulatory Visit: Payer: Self-pay | Admitting: Internal Medicine

## 2021-04-21 NOTE — Telephone Encounter (Signed)
Last filled and last seen 02/10/2021...Marland Kitchen please advise

## 2021-08-31 ENCOUNTER — Other Ambulatory Visit: Payer: Self-pay

## 2021-08-31 MED ORDER — LABETALOL HCL 100 MG PO TABS: 100.0000 mg | ORAL_TABLET | Freq: Three times a day (TID) | ORAL | 1 refills | Status: DC

## 2022-01-03 ENCOUNTER — Ambulatory Visit: Payer: Managed Care, Other (non HMO) | Admitting: Internal Medicine

## 2022-02-22 ENCOUNTER — Telehealth: Payer: Self-pay

## 2022-02-24 NOTE — Telephone Encounter (Signed)
Error.opened chart by mistake. ?

## 2022-02-27 ENCOUNTER — Telehealth: Payer: Self-pay

## 2022-05-24 ENCOUNTER — Ambulatory Visit (INDEPENDENT_AMBULATORY_CARE_PROVIDER_SITE_OTHER): Payer: Managed Care, Other (non HMO) | Admitting: Internal Medicine

## 2022-05-24 ENCOUNTER — Other Ambulatory Visit (HOSPITAL_COMMUNITY)
Admission: RE | Admit: 2022-05-24 | Discharge: 2022-05-24 | Disposition: A | Discharge status: Home / Self Care | Payer: Managed Care, Other (non HMO) | Source: Ambulatory Visit | Attending: Internal Medicine | Admitting: Internal Medicine

## 2022-05-24 ENCOUNTER — Encounter: Payer: Self-pay | Admitting: Internal Medicine

## 2022-05-24 VITALS — BP 184/108 | HR 69 | Temp 96.9°F | Ht 68.0 in | Wt 250.0 lb

## 2022-05-24 DIAGNOSIS — Z0001 Encounter for general adult medical examination with abnormal findings: Secondary | ICD-10-CM | POA: Diagnosis not present

## 2022-05-24 DIAGNOSIS — Z23 Encounter for immunization: Secondary | ICD-10-CM | POA: Diagnosis not present

## 2022-05-24 DIAGNOSIS — Z1231 Encounter for screening mammogram for malignant neoplasm of breast: Secondary | ICD-10-CM | POA: Diagnosis not present

## 2022-05-24 DIAGNOSIS — Z124 Encounter for screening for malignant neoplasm of cervix: Secondary | ICD-10-CM

## 2022-05-24 DIAGNOSIS — E7800 Pure hypercholesterolemia, unspecified: Secondary | ICD-10-CM | POA: Diagnosis not present

## 2022-05-24 DIAGNOSIS — Z6838 Body mass index (BMI) 38.0-38.9, adult: Secondary | ICD-10-CM

## 2022-05-24 DIAGNOSIS — I10 Essential (primary) hypertension: Secondary | ICD-10-CM

## 2022-05-24 DIAGNOSIS — E66812 Obesity, class 2: Secondary | ICD-10-CM | POA: Insufficient documentation

## 2022-05-24 DIAGNOSIS — R7303 Prediabetes: Secondary | ICD-10-CM | POA: Diagnosis not present

## 2022-05-24 DIAGNOSIS — E6609 Other obesity due to excess calories: Secondary | ICD-10-CM | POA: Insufficient documentation

## 2022-05-24 DIAGNOSIS — Z1211 Encounter for screening for malignant neoplasm of colon: Secondary | ICD-10-CM

## 2022-05-24 DIAGNOSIS — Z6836 Body mass index (BMI) 36.0-36.9, adult: Secondary | ICD-10-CM | POA: Insufficient documentation

## 2022-05-24 MED ORDER — LABETALOL HCL 100 MG PO TABS: 100.0000 mg | ORAL_TABLET | Freq: Three times a day (TID) | ORAL | 0 refills | Status: DC

## 2022-05-24 NOTE — Progress Notes (Signed)
Subjective:    Patient ID: Claudia Glenn, female    DOB: 1975-12-26, 46 y.o.   MRN: [id number]  HPI  Patient presents to clinic today for her annual exam.  Flu: Never Tetanus: 12/2009 COVID: Never Pap smear: 09/2015 Mammogram: Never Colon screening: Never Vision screening: annually Dentist: biannually  Diet: She does eat some meat. She does not eat a lot of fruits or veggies. She does eat some fried foods. She drinks mostly Mt. Dew. Exercise: None  Review of Systems     Past Medical History:  Diagnosis Date   Frequent headaches    Hypertension     Current Outpatient Medications  Medication Sig Dispense Refill   amitriptyline (ELAVIL) 25 MG tablet Take 2 tablets (50 mg total) by mouth at bedtime. 180 tablet 0   amLODipine (NORVASC) 10 MG tablet TAKE ONE TABLET BY MOUTH DAILY 30 tablet 0   cyclobenzaprine (FLEXERIL) 10 MG tablet Take 1 tablet (10 mg total) by mouth 3 (three) times daily as needed for muscle spasms. 30 tablet 0   Ibuprofen 200 MG CAPS Take 400 mg by mouth as needed.     ipratropium (ATROVENT) 0.03 % nasal spray Place 2 sprays into both nostrils every 12 (twelve) hours. 30 mL 0   labetalol (NORMODYNE) 100 MG tablet Take 1 tablet (100 mg total) by mouth 3 (three) times daily. 90 tablet 1   No current facility-administered medications for this visit.    Allergies  Allergen Reactions   Losartan Other (See Comments)    Chest tightness   Tramadol Other (See Comments)    dizziness    Family History  Problem Relation Age of Onset   Irregular heart beat Mother    Cancer Neg Hx    Diabetes Neg Hx    Heart disease Neg Hx    Stroke Neg Hx     Social History   Socioeconomic History   Marital status: Married    Spouse name: Not on file   Number of children: Not on file   Years of education: Not on file   Highest education level: Not on file  Occupational History   Not on file  Tobacco Use   Smoking status: Former    Packs/day: 0.50    Types:  Cigarettes   Smokeless tobacco: Never   Tobacco comments:    quit 2014  Substance and Sexual Activity   Alcohol use: Not Currently    Alcohol/week: 0.0 standard drinks of alcohol    Comment: rare   Drug use: No   Sexual activity: Yes    Birth control/protection: None  Other Topics Concern   Not on file  Social History Narrative   Not on file   Social Determinants of Health   Financial Resource Strain: Not on file  Food Insecurity: Not on file  Transportation Needs: Not on file  Physical Activity: Not on file  Stress: Not on file  Social Connections: Not on file  Intimate Partner Violence: Not on file     Constitutional: Patient reports intermittent headaches.  Denies fever, malaise, fatigue, or abrupt weight changes.  HEENT: Denies eye pain, eye redness, ear pain, ringing in the ears, wax buildup, runny nose, nasal congestion, bloody nose, or sore throat. Respiratory: Denies difficulty breathing, shortness of breath, cough or sputum production.   Cardiovascular: Denies chest pain, chest tightness, palpitations or swelling in the hands or feet.  Gastrointestinal: Denies abdominal pain, bloating, constipation, diarrhea or blood in the stool.  GU: Patient reports  painful periods.  Denies urgency, frequency, pain with urination, burning sensation, blood in urine, odor or discharge. Musculoskeletal: Patient reports intermittent neck pain.  Denies decrease in range of motion, difficulty with gait, or joint swelling.  Skin: Denies redness, rashes, lesions or ulcercations.  Neurological: Denies dizziness, difficulty with memory, difficulty with speech or problems with balance and coordination.  Psych: Denies anxiety, depression, SI/HI.  No other specific complaints in a complete review of systems (except as listed in HPI above).  Objective:   Physical Exam   BP (!) 184/108 (BP Location: Right Arm, Patient Position: Sitting, Cuff Size: Large)   Pulse 69   Temp (!) 96.9 F (36.1  C) (Temporal)   Ht 5' 8"  (1.727 m)   Wt 250 lb (113.4 kg)   SpO2 99%   BMI 38.01 kg/m   Wt Readings from Last 3 Encounters:  02/10/21 259 lb (117.5 kg)  01/24/21 261 lb (118.4 kg)  01/11/21 258 lb (117 kg)    General: Appears her stated age, obese, in NAD. Skin: Warm, dry and intact.  HEENT: Head: normal shape and size; Eyes: sclera white, no icterus, conjunctiva pink, PERRLA and EOMs intact;  Neck:  Neck supple, trachea midline. No masses, lumps or thyromegaly present.  Cardiovascular: Normal rate and rhythm. S1,S2 noted.  No murmur, rubs or gallops noted. No JVD or BLE edema.  Pulmonary/Chest: Normal effort and positive vesicular breath sounds. No respiratory distress. No wheezes, rales or ronchi noted.  Abdomen: Soft and nontender. Normal bowel sounds.  Pelvic: Normal female anatomy.  Cervix without mass or lesion.  No CMT.  Adnexa nonpalpable. Musculoskeletal: Strength 5/5 BUE/BLE.  No difficulty with gait.  Neurological: Alert and oriented. Cranial nerves II-XII grossly intact. Coordination normal.  Psychiatric: Mood and affect normal. Behavior is normal. Judgment and thought content normal.    BMET    Component Value Date/Time   NA 134 (L) 11/02/2020 1144   K 4.6 11/02/2020 1144   CL 100 11/02/2020 1144   CO2 27 11/02/2020 1144   GLUCOSE 95 11/02/2020 1144   BUN 15 11/02/2020 1144   CREATININE 0.80 11/02/2020 1144   CALCIUM 9.5 11/02/2020 1144   GFRNONAA >60 10/02/2015 1212   GFRAA >60 10/02/2015 1212    Lipid Panel     Component Value Date/Time   CHOL 184 11/02/2020 1144   TRIG 88.0 11/02/2020 1144   HDL 45.30 11/02/2020 1144   CHOLHDL 4 11/02/2020 1144   VLDL 17.6 11/02/2020 1144   LDLCALC 121 (H) 11/02/2020 1144    CBC    Component Value Date/Time   WBC 9.0 11/02/2020 1144   RBC 4.78 11/02/2020 1144   HGB 14.6 11/02/2020 1144   HCT 43.5 11/02/2020 1144   PLT 232.0 11/02/2020 1144   MCV 91.0 11/02/2020 1144   MCH 30.5 10/02/2015 1212   MCHC 33.6  11/02/2020 1144   RDW 14.4 11/02/2020 1144    Hgb A1C Lab Results  Component Value Date   HGBA1C 5.8 11/02/2020           Assessment & Plan:   Preventative Health Maintenance:  Encouraged her to get a flu shot in the fall Tetanus today Encouraged her to get a COVID-vaccine Pap smear today-she declines STD screening Mammogram ordered-she will call to schedule Cologuard ordered Encouraged her to consume a balanced diet and exercise regimen Advised her to see an eye doctor and dentist annually We will check CBC, c-Met, lipid, A1c today  RTC in 6 months, follow-up chronic conditions  Webb Silversmith, NP

## 2022-05-24 NOTE — Patient Instructions (Signed)

## 2022-05-24 NOTE — Assessment & Plan Note (Signed)
Uncontrolled off meds Refilled Labetalol 100 mg 3 times daily Reinforced DASH diet and exercise for weight loss  Update me in 2 weeks with BP readings

## 2022-05-24 NOTE — Assessment & Plan Note (Signed)
Encourage diet and exercise for weight loss

## 2022-05-25 LAB — COMPLETE METABOLIC PANEL WITH GFR
AG Ratio: 1.9 (calc) (ref 1.0–2.5)
ALT: 14 U/L (ref 6–29)
AST: 15 U/L (ref 10–35)
Albumin: 4.4 g/dL (ref 3.6–5.1)
Alkaline phosphatase (APISO): 120 U/L (ref 31–125)
BUN: 17 mg/dL (ref 7–25)
CO2: 28 mmol/L (ref 20–32)
Calcium: 9.6 mg/dL (ref 8.6–10.2)
Chloride: 102 mmol/L (ref 98–110)
Creat: 0.72 mg/dL (ref 0.50–0.99)
Globulin: 2.3 g/dL (calc) (ref 1.9–3.7)
Glucose, Bld: 92 mg/dL (ref 65–99)
Potassium: 5 mmol/L (ref 3.5–5.3)
Sodium: 137 mmol/L (ref 135–146)
Total Bilirubin: 0.5 mg/dL (ref 0.2–1.2)
Total Protein: 6.7 g/dL (ref 6.1–8.1)
eGFR: 105 mL/min/{1.73_m2} (ref >=60)

## 2022-05-25 LAB — LIPID PANEL
Cholesterol: 240 mg/dL — ABNORMAL HIGH (ref <200)
HDL: 58 mg/dL (ref >=50)
LDL Cholesterol (Calc): 157 mg/dL (calc) — ABNORMAL HIGH
Non-HDL Cholesterol (Calc): 182 mg/dL (calc) — ABNORMAL HIGH (ref <130)
Total CHOL/HDL Ratio: 4.1 (calc) (ref <5.0)
Triglycerides: 129 mg/dL (ref <150)

## 2022-05-25 LAB — CBC
HCT: 47.7 % — ABNORMAL HIGH (ref 35.0–45.0)
Hemoglobin: 15.9 g/dL — ABNORMAL HIGH (ref 11.7–15.5)
MCH: 31.2 pg (ref 27.0–33.0)
MCHC: 33.3 g/dL (ref 32.0–36.0)
MCV: 93.5 fL (ref 80.0–100.0)
MPV: 10.4 fL (ref 7.5–12.5)
Platelets: 272 10*3/uL (ref 140–400)
RBC: 5.1 10*6/uL (ref 3.80–5.10)
RDW: 13.1 % (ref 11.0–15.0)
WBC: 9.4 10*3/uL (ref 3.8–10.8)

## 2022-05-25 LAB — HEMOGLOBIN A1C
Hgb A1c MFr Bld: 5.3 % of total Hgb (ref <5.7)
Mean Plasma Glucose: 105 mg/dL
eAG (mmol/L): 5.8 mmol/L

## 2022-05-25 MED ORDER — ATORVASTATIN CALCIUM 10 MG PO TABS
10.0000 mg | ORAL_TABLET | Freq: Every day | ORAL | 1 refills | Status: DC
Start: 2022-05-25 — End: 2024-04-18

## 2022-05-25 NOTE — Addendum Note (Signed)
Addended by: Jearld Fenton on: 05/25/2022 02:43 PM   Modules accepted: Orders

## 2022-05-29 LAB — CYTOLOGY - PAP
Adequacy: ABSENT
Comment: NEGATIVE
Diagnosis: UNDETERMINED — AB
High risk HPV: NEGATIVE

## 2022-06-09 ENCOUNTER — Ambulatory Visit
Admission: RE | Admit: 2022-06-09 | Discharge: 2022-06-09 | Disposition: A | Discharge status: Home / Self Care | Payer: Managed Care, Other (non HMO) | Source: Ambulatory Visit | Attending: Internal Medicine | Admitting: Internal Medicine

## 2022-06-09 DIAGNOSIS — Z1231 Encounter for screening mammogram for malignant neoplasm of breast: Secondary | ICD-10-CM | POA: Diagnosis present

## 2022-06-16 LAB — COLOGUARD: COLOGUARD: NEGATIVE

## 2022-08-25 ENCOUNTER — Other Ambulatory Visit: Payer: Self-pay

## 2022-08-25 DIAGNOSIS — E7800 Pure hypercholesterolemia, unspecified: Secondary | ICD-10-CM

## 2022-08-28 ENCOUNTER — Other Ambulatory Visit: Payer: Managed Care, Other (non HMO)

## 2022-08-29 LAB — COMPLETE METABOLIC PANEL WITH GFR
AG Ratio: 2 (calc) (ref 1.0–2.5)
ALT: 13 U/L (ref 6–29)
AST: 11 U/L (ref 10–35)
Albumin: 4.4 g/dL (ref 3.6–5.1)
Alkaline phosphatase (APISO): 116 U/L (ref 31–125)
BUN: 10 mg/dL (ref 7–25)
CO2: 24 mmol/L (ref 20–32)
Calcium: 9.3 mg/dL (ref 8.6–10.2)
Chloride: 104 mmol/L (ref 98–110)
Creat: 0.67 mg/dL (ref 0.50–0.99)
Globulin: 2.2 g/dL (calc) (ref 1.9–3.7)
Glucose, Bld: 102 mg/dL — ABNORMAL HIGH (ref 65–99)
Potassium: 4.3 mmol/L (ref 3.5–5.3)
Sodium: 138 mmol/L (ref 135–146)
Total Bilirubin: 0.5 mg/dL (ref 0.2–1.2)
Total Protein: 6.6 g/dL (ref 6.1–8.1)
eGFR: 110 mL/min/{1.73_m2} (ref >=60)

## 2022-08-29 LAB — LIPID PANEL
Cholesterol: 151 mg/dL (ref <200)
HDL: 49 mg/dL — ABNORMAL LOW (ref >=50)
LDL Cholesterol (Calc): 81 mg/dL (calc)
Non-HDL Cholesterol (Calc): 102 mg/dL (calc) (ref <130)
Total CHOL/HDL Ratio: 3.1 (calc) (ref <5.0)
Triglycerides: 116 mg/dL (ref <150)

## 2022-10-05 ENCOUNTER — Other Ambulatory Visit: Payer: Self-pay

## 2022-10-05 ENCOUNTER — Telehealth: Payer: Self-pay

## 2022-10-05 MED ORDER — LABETALOL HCL 100 MG PO TABS: 100.0000 mg | ORAL_TABLET | Freq: Three times a day (TID) | ORAL | 0 refills | Status: DC

## 2022-10-05 NOTE — Telephone Encounter (Signed)
Refill sent and 6 month follow up appt scheduled.  KP

## 2022-10-05 NOTE — Telephone Encounter (Signed)
Copied from [place] [phone]. Topic: Appointment Scheduling - Scheduling Inquiry for Clinic >> Oct 04, 2022  2:29 PM Leitha Schuller wrote: Pt inquiring if she need an appt to get her bp medication refilled  Pt declined to schedule an appt until it is confirmed  Please fu w/ pt

## 2022-11-24 ENCOUNTER — Ambulatory Visit: Payer: Managed Care, Other (non HMO) | Admitting: Internal Medicine

## 2022-12-01 ENCOUNTER — Other Ambulatory Visit: Payer: Self-pay | Admitting: Internal Medicine

## 2022-12-01 NOTE — Telephone Encounter (Signed)
Courtesy refill. Future appt in 3 weeks . Requested Prescriptions  Pending Prescriptions Disp Refills   labetalol (NORMODYNE) 100 MG tablet [Pharmacy Med Name: LABETALOL HCL 100 MG TABLET] 180 tablet 0    Sig: TAKE ONE TABLET BY MOUTH THREE TIMES A DAY     Cardiovascular:  Beta Blockers Failed - 12/01/2022  6:11 AM      Failed - Last BP in normal range    BP Readings from Last 1 Encounters:  05/24/22 (!) 184/108         Failed - Valid encounter within last 6 months    Recent Outpatient Visits           6 months ago Encounter for general adult medical examination with abnormal findings   [facility], Salvadore Oxford, NP       Future Appointments             In 3 weeks Sampson Si, Salvadore Oxford, NP [facility]            Passed - Last Heart Rate in normal range    Pulse Readings from Last 1 Encounters:  05/24/22 69

## 2022-12-25 ENCOUNTER — Ambulatory Visit (INDEPENDENT_AMBULATORY_CARE_PROVIDER_SITE_OTHER): Payer: Managed Care, Other (non HMO) | Admitting: Internal Medicine

## 2022-12-25 ENCOUNTER — Ambulatory Visit
Admission: RE | Admit: 2022-12-25 | Discharge: 2022-12-25 | Disposition: A | Discharge status: Home / Self Care | Payer: Managed Care, Other (non HMO) | Source: Ambulatory Visit | Attending: Internal Medicine | Admitting: Internal Medicine

## 2022-12-25 ENCOUNTER — Ambulatory Visit
Admission: RE | Admit: 2022-12-25 | Discharge: 2022-12-25 | Disposition: A | Discharge status: Home / Self Care | Payer: Managed Care, Other (non HMO) | Attending: Internal Medicine | Admitting: Internal Medicine

## 2022-12-25 ENCOUNTER — Encounter: Payer: Self-pay | Admitting: Internal Medicine

## 2022-12-25 VITALS — BP 128/84 | HR 61 | Temp 97.3°F | Wt 241.0 lb

## 2022-12-25 DIAGNOSIS — Q740 Other congenital malformations of upper limb(s), including shoulder girdle: Secondary | ICD-10-CM | POA: Insufficient documentation

## 2022-12-25 DIAGNOSIS — E7800 Pure hypercholesterolemia, unspecified: Secondary | ICD-10-CM | POA: Diagnosis not present

## 2022-12-25 DIAGNOSIS — R7303 Prediabetes: Secondary | ICD-10-CM

## 2022-12-25 DIAGNOSIS — M542 Cervicalgia: Secondary | ICD-10-CM

## 2022-12-25 DIAGNOSIS — I10 Essential (primary) hypertension: Secondary | ICD-10-CM

## 2022-12-25 DIAGNOSIS — Z6836 Body mass index (BMI) 36.0-36.9, adult: Secondary | ICD-10-CM

## 2022-12-25 DIAGNOSIS — G8929 Other chronic pain: Secondary | ICD-10-CM

## 2022-12-25 NOTE — Assessment & Plan Note (Signed)
Controlled on labetalol Reinforced DASH diet and exercise for weight loss

## 2022-12-25 NOTE — Assessment & Plan Note (Signed)
Encourage diet and exercise for weight loss 

## 2022-12-25 NOTE — Progress Notes (Signed)
Subjective:    Patient ID: Claudia Glenn, female    DOB: Aug 23, 1976, 47 y.o.   MRN: [id number]  HPI  Patient presents to clinic today for 56-month follow-up of chronic conditions.  HTN: Her BP today is 128/74.  She is taking Labetalol as prescribed.  ECG from 09/2015 reviewed.  HLD: Her last LDL was 81, triglycerides 160, 08/2022.  She denies myalgias on Atorvastatin.  She tries to consume a low-fat diet.  Prediabetes: Her last A1c was 5.3%, 05/2022.  She is not taking any oral diabetic medication at this time.  She does not check her sugars.  Frequent Headaches secondary to Chronic Neck Pain: These no longer occur. Managed with Ibuprofen as  needed.  She does not follow with orthopedics.  Polycythemia: Her last H/H was 15.9/47.7, 05/2022.  She does smoke.  She does not follow with hematology.  She has noted prominence of her right clavicle.  This is not painful.  She denies any injury to the area.  Review of Systems     Past Medical History:  Diagnosis Date   Frequent headaches    Hypertension     Current Outpatient Medications  Medication Sig Dispense Refill   atorvastatin (LIPITOR) 10 MG tablet Take 1 tablet (10 mg total) by mouth daily. 90 tablet 1   Ibuprofen 200 MG CAPS Take 400 mg by mouth as needed.     ipratropium (ATROVENT) 0.03 % nasal spray Place 2 sprays into both nostrils every 12 (twelve) hours. 30 mL 0   labetalol (NORMODYNE) 100 MG tablet TAKE ONE TABLET BY MOUTH THREE TIMES A DAY 180 tablet 0   No current facility-administered medications for this visit.    Allergies  Allergen Reactions   Losartan Other (See Comments)    Chest tightness   Tramadol Other (See Comments)    dizziness    Family History  Problem Relation Age of Onset   Irregular heart beat Mother    Alcohol abuse Sister    Healthy Sister    Cancer Neg Hx    Diabetes Neg Hx    Heart disease Neg Hx    Stroke Neg Hx     Social History   Socioeconomic History   Marital status:  Married    Spouse name: Not on file   Number of children: Not on file   Years of education: Not on file   Highest education level: Not on file  Occupational History   Not on file  Tobacco Use   Smoking status: Former    Packs/day: 0.50    Types: Cigarettes   Smokeless tobacco: Never   Tobacco comments:    quit 2014  Vaping Use   Vaping Use: Never used  Substance and Sexual Activity   Alcohol use: Not Currently    Alcohol/week: 0.0 standard drinks of alcohol    Comment: rare   Drug use: No   Sexual activity: Yes    Birth control/protection: None  Other Topics Concern   Not on file  Social History Narrative   Not on file   Social Determinants of Health   Financial Resource Strain: Not on file  Food Insecurity: Not on file  Transportation Needs: Not on file  Physical Activity: Not on file  Stress: Not on file  Social Connections: Not on file  Intimate Partner Violence: Not on file     Constitutional: Patient reports intermittent headaches.  Denies fever, malaise, fatigue, or abrupt weight changes.  HEENT: Denies eye pain, eye redness,  ear pain, ringing in the ears, wax buildup, runny nose, nasal congestion, bloody nose, or sore throat. Respiratory: Denies difficulty breathing, shortness of breath, cough or sputum production.   Cardiovascular: Denies chest pain, chest tightness, palpitations or swelling in the hands or feet.  Gastrointestinal: Denies abdominal pain, bloating, constipation, diarrhea or blood in the stool.  GU: Denies urgency, frequency, pain with urination, burning sensation, blood in urine, odor or discharge. Musculoskeletal: Patient reports chronic neck pain, mass of right clavicle.  Denies decrease in range of motion, difficulty with gait, or joint swelling.  Skin: Denies redness, rashes, lesions or ulcercations.  Neurological: Denies dizziness, difficulty with memory, difficulty with speech or problems with balance and coordination.  Psych: Denies  anxiety, depression, SI/HI.  No other specific complaints in a complete review of systems (except as listed in HPI above).  Objective:   Physical Exam  BP 128/84 (BP Location: Right Arm, Patient Position: Sitting, Cuff Size: Large)   Pulse 61   Temp (!) 97.3 F (36.3 C) (Temporal)   Wt 241 lb (109.3 kg)   SpO2 98%   BMI 36.64 kg/m   Wt Readings from Last 3 Encounters:  05/24/22 250 lb (113.4 kg)  02/10/21 259 lb (117.5 kg)  01/24/21 261 lb (118.4 kg)    General: Appears her stated age, obese, in NAD. Skin: Warm, dry and intact.  HEENT: Head: normal shape and size; Eyes: sclera white, no icterus, conjunctiva pink, PERRLA and EOMs intact;  Cardiovascular: Normal rate and rhythm. S1,S2 noted.  No murmur, rubs or gallops noted. No JVD or BLE edema.  Pulmonary/Chest: Normal effort and positive vesicular breath sounds. No respiratory distress. No wheezes, rales or ronchi noted.  Musculoskeletal: Prominence noted of the right clavicle at the clavicular sternal joint. No difficulty with gait.  Neurological: Alert and oriented.  Psychiatric: Mood and affect normal. Behavior is normal. Judgment and thought content normal.    BMET    Component Value Date/Time   NA 138 08/28/2022 0856   K 4.3 08/28/2022 0856   CL 104 08/28/2022 0856   CO2 24 08/28/2022 0856   GLUCOSE 102 (H) 08/28/2022 0856   BUN 10 08/28/2022 0856   CREATININE 0.67 08/28/2022 0856   CALCIUM 9.3 08/28/2022 0856   GFRNONAA >60 10/02/2015 1212   GFRAA >60 10/02/2015 1212    Lipid Panel     Component Value Date/Time   CHOL 151 08/28/2022 0856   TRIG 116 08/28/2022 0856   HDL 49 (L) 08/28/2022 0856   CHOLHDL 3.1 08/28/2022 0856   VLDL 17.6 11/02/2020 1144   LDLCALC 81 08/28/2022 0856    CBC    Component Value Date/Time   WBC 9.4 05/24/2022 0903   RBC 5.10 05/24/2022 0903   HGB 15.9 (H) 05/24/2022 0903   HCT 47.7 (H) 05/24/2022 0903   PLT 272 05/24/2022 0903   MCV 93.5 05/24/2022 0903   MCH 31.2  05/24/2022 0903   MCHC 33.3 05/24/2022 0903   RDW 13.1 05/24/2022 0903    Hgb A1C Lab Results  Component Value Date   HGBA1C 5.3 05/24/2022            Assessment & Plan:   Right Clavicular Prominence:  X-ray right clavicle today for further evaluation of symptoms  RTC in 6 months for your annual exam Webb Silversmith, NP

## 2022-12-25 NOTE — Assessment & Plan Note (Signed)
A1c today Encourage low-carb diet and exercise for weight loss 

## 2022-12-25 NOTE — Patient Instructions (Signed)

## 2022-12-25 NOTE — Assessment & Plan Note (Signed)
Encourage regular stretching 

## 2022-12-25 NOTE — Assessment & Plan Note (Signed)
C-Met and lipid profile today Encouraged her to consume low-fat diet 

## 2022-12-26 LAB — CBC
HCT: 40.9 % (ref 35.0–45.0)
Hemoglobin: 13.9 g/dL (ref 11.7–15.5)
MCH: 31 pg (ref 27.0–33.0)
MCHC: 34 g/dL (ref 32.0–36.0)
MCV: 91.3 fL (ref 80.0–100.0)
MPV: 11.4 fL (ref 7.5–12.5)
Platelets: 237 10*3/uL (ref 140–400)
RBC: 4.48 10*6/uL (ref 3.80–5.10)
RDW: 13 % (ref 11.0–15.0)
WBC: 12.6 10*3/uL — ABNORMAL HIGH (ref 3.8–10.8)

## 2022-12-26 LAB — COMPLETE METABOLIC PANEL WITH GFR
AG Ratio: 1.9 (calc) (ref 1.0–2.5)
ALT: 12 U/L (ref 6–29)
AST: 12 U/L (ref 10–35)
Albumin: 4.2 g/dL (ref 3.6–5.1)
Alkaline phosphatase (APISO): 97 U/L (ref 31–125)
BUN: 15 mg/dL (ref 7–25)
CO2: 27 mmol/L (ref 20–32)
Calcium: 9.5 mg/dL (ref 8.6–10.2)
Chloride: 104 mmol/L (ref 98–110)
Creat: 0.64 mg/dL (ref 0.50–0.99)
Globulin: 2.2 g/dL (calc) (ref 1.9–3.7)
Glucose, Bld: 80 mg/dL (ref 65–99)
Potassium: 4.1 mmol/L (ref 3.5–5.3)
Sodium: 140 mmol/L (ref 135–146)
Total Bilirubin: 0.3 mg/dL (ref 0.2–1.2)
Total Protein: 6.4 g/dL (ref 6.1–8.1)
eGFR: 110 mL/min/{1.73_m2} (ref >=60)

## 2022-12-26 LAB — HEMOGLOBIN A1C
Hgb A1c MFr Bld: 5.8 % of total Hgb — ABNORMAL HIGH (ref <5.7)
Mean Plasma Glucose: 120 mg/dL
eAG (mmol/L): 6.6 mmol/L

## 2022-12-26 LAB — LIPID PANEL
Cholesterol: 194 mg/dL (ref <200)
HDL: 48 mg/dL — ABNORMAL LOW (ref >=50)
LDL Cholesterol (Calc): 119 mg/dL (calc) — ABNORMAL HIGH
Non-HDL Cholesterol (Calc): 146 mg/dL (calc) — ABNORMAL HIGH (ref <130)
Total CHOL/HDL Ratio: 4 (calc) (ref <5.0)
Triglycerides: 158 mg/dL — ABNORMAL HIGH (ref <150)

## 2023-03-13 ENCOUNTER — Other Ambulatory Visit: Payer: Self-pay | Admitting: Internal Medicine

## 2023-03-13 NOTE — Telephone Encounter (Signed)
Future visit in 3 months.  Requested Prescriptions  Pending Prescriptions Disp Refills   labetalol (NORMODYNE) 100 MG tablet [Pharmacy Med Name: LABETALOL HCL 100 MG TABLET] 180 tablet 0    Sig: TAKE ONE TABLET BY MOUTH THREE TIMES A DAY     Cardiovascular:  Beta Blockers Passed - 03/13/2023  6:11 AM      Passed - Last BP in normal range    BP Readings from Last 1 Encounters:  12/25/22 128/84         Passed - Last Heart Rate in normal range    Pulse Readings from Last 1 Encounters:  12/25/22 61         Passed - Valid encounter within last 6 months    Recent Outpatient Visits           2 months ago Prediabetes   [facility] [place], Coralie Keens, NP   9 months ago Encounter for general adult medical examination with abnormal findings   [facility] [place], Coralie Keens, NP       Future Appointments             In 3 months Baity, Coralie Keens, NP [facility], [facility]

## 2023-05-07 ENCOUNTER — Other Ambulatory Visit: Payer: Self-pay | Admitting: Internal Medicine

## 2023-05-08 NOTE — Telephone Encounter (Signed)
Requested Prescriptions  Pending Prescriptions Disp Refills   labetalol (NORMODYNE) 100 MG tablet [Pharmacy Med Name: LABETALOL HCL 100 MG TABLET] 180 tablet 0    Sig: TAKE 1 TABLET BY MOUTH 3 TIMES A DAY     Cardiovascular:  Beta Blockers Passed - 05/07/2023  9:25 AM      Passed - Last BP in normal range    BP Readings from Last 1 Encounters:  12/25/22 128/84         Passed - Last Heart Rate in normal range    Pulse Readings from Last 1 Encounters:  12/25/22 61         Passed - Valid encounter within last 6 months    Recent Outpatient Visits           4 months ago Prediabetes   [facility] [place], Salvadore Oxford, NP   11 months ago Encounter for general adult medical examination with abnormal findings   [facility], Salvadore Oxford, NP       Future Appointments             In 1 month Baity, Salvadore Oxford, NP [facility], [facility]

## 2023-06-22 ENCOUNTER — Encounter: Payer: Managed Care, Other (non HMO) | Admitting: Internal Medicine

## 2023-07-27 ENCOUNTER — Encounter: Payer: Managed Care, Other (non HMO) | Admitting: Internal Medicine

## 2023-07-27 NOTE — Progress Notes (Deleted)
Subjective:    Patient ID: Claudia Glenn, female    DOB: 24-Oct-1976, 47 y.o.   MRN: [id number]  HPI  Patient presents to clinic today for her annual exam.  Flu: Never Tetanus: 05/2022 COVID: Never Pap smear: 05/2022, abnormal Mammogram: 06/2022 Colon screening: 05/2022, Cologuard Vision screening: Dentist:  Diet: Exercise:  Review of Systems     Past Medical History:  Diagnosis Date   Frequent headaches    Hypertension     Current Outpatient Medications  Medication Sig Dispense Refill   atorvastatin (LIPITOR) 10 MG tablet Take 1 tablet (10 mg total) by mouth daily. 90 tablet 1   Ibuprofen 200 MG CAPS Take 400 mg by mouth as needed.     ipratropium (ATROVENT) 0.03 % nasal spray Place 2 sprays into both nostrils every 12 (twelve) hours. 30 mL 0   labetalol (NORMODYNE) 100 MG tablet TAKE 1 TABLET BY MOUTH 3 TIMES A DAY 180 tablet 0   No current facility-administered medications for this visit.    Allergies  Allergen Reactions   Losartan Other (See Comments)    Chest tightness   Tramadol Other (See Comments)    dizziness    Family History  Problem Relation Age of Onset   Irregular heart beat Mother    Alcohol abuse Sister    Healthy Sister    Cancer Neg Hx    Diabetes Neg Hx    Heart disease Neg Hx    Stroke Neg Hx     Social History   Socioeconomic History   Marital status: Married    Spouse name: Not on file   Number of children: Not on file   Years of education: Not on file   Highest education level: Not on file  Occupational History   Not on file  Tobacco Use   Smoking status: Former    Current packs/day: 0.50    Types: Cigarettes   Smokeless tobacco: Never   Tobacco comments:    quit 2014  Vaping Use   Vaping status: Never Used  Substance and Sexual Activity   Alcohol use: Not Currently    Alcohol/week: 0.0 standard drinks of alcohol    Comment: rare   Drug use: No   Sexual activity: Yes    Birth control/protection: None  Other  Topics Concern   Not on file  Social History Narrative   Not on file   Social Determinants of Health   Financial Resource Strain: Not on file  Food Insecurity: Not on file  Transportation Needs: Not on file  Physical Activity: Not on file  Stress: Not on file  Social Connections: Not on file  Intimate Partner Violence: Not on file     Constitutional: Patient reports intermittent headaches.  Denies fever, malaise, fatigue, or abrupt weight changes.  HEENT: Denies eye pain, eye redness, ear pain, ringing in the ears, wax buildup, runny nose, nasal congestion, bloody nose, or sore throat. Respiratory: Denies difficulty breathing, shortness of breath, cough or sputum production.   Cardiovascular: Denies chest pain, chest tightness, palpitations or swelling in the hands or feet.  Gastrointestinal: Denies abdominal pain, bloating, constipation, diarrhea or blood in the stool.  GU: Denies urgency, frequency, pain with urination, burning sensation, blood in urine, odor or discharge. Musculoskeletal: Patient reports chronic neck pain.  Denies decrease in range of motion, difficulty with gait, or joint swelling.  Skin: Denies redness, rashes, lesions or ulcercations.  Neurological: Denies dizziness, difficulty with memory, difficulty with speech or problems with balance  and coordination.  Psych: Denies anxiety, depression, SI/HI.  No other specific complaints in a complete review of systems (except as listed in HPI above).  Objective:   Physical Exam  There were no vitals taken for this visit. Wt Readings from Last 3 Encounters:  12/25/22 241 lb (109.3 kg)  05/24/22 250 lb (113.4 kg)  02/10/21 259 lb (117.5 kg)    General: Appears their stated age, well developed, well nourished in NAD. Skin: Warm, dry and intact. No rashes, lesions or ulcerations noted. HEENT: Head: normal shape and size; Eyes: sclera white, no icterus, conjunctiva pink, PERRLA and EOMs intact; Ears: Tm's gray and  intact, normal light reflex; Nose: mucosa pink and moist, septum midline; Throat/Mouth: Teeth present, mucosa pink and moist, no exudate, lesions or ulcerations noted.  Neck:  Neck supple, trachea midline. No masses, lumps or thyromegaly present.  Cardiovascular: Normal rate and rhythm. S1,S2 noted.  No murmur, rubs or gallops noted. No JVD or BLE edema. No carotid bruits noted. Pulmonary/Chest: Normal effort and positive vesicular breath sounds. No respiratory distress. No wheezes, rales or ronchi noted.  Abdomen: Soft and nontender. Normal bowel sounds. No distention or masses noted. Liver, spleen and kidneys non palpable. Musculoskeletal: Normal range of motion. No signs of joint swelling. No difficulty with gait.  Neurological: Alert and oriented. Cranial nerves II-XII grossly intact. Coordination normal.  Psychiatric: Mood and affect normal. Behavior is normal. Judgment and thought content normal.     BMET    Component Value Date/Time   NA 140 12/25/2022 1524   K 4.1 12/25/2022 1524   CL 104 12/25/2022 1524   CO2 27 12/25/2022 1524   GLUCOSE 80 12/25/2022 1524   BUN 15 12/25/2022 1524   CREATININE 0.64 12/25/2022 1524   CALCIUM 9.5 12/25/2022 1524   GFRNONAA >60 10/02/2015 1212   GFRAA >60 10/02/2015 1212    Lipid Panel     Component Value Date/Time   CHOL 194 12/25/2022 1524   TRIG 158 (H) 12/25/2022 1524   HDL 48 (L) 12/25/2022 1524   CHOLHDL 4.0 12/25/2022 1524   VLDL 17.6 11/02/2020 1144   LDLCALC 119 (H) 12/25/2022 1524    CBC    Component Value Date/Time   WBC 12.6 (H) 12/25/2022 1524   RBC 4.48 12/25/2022 1524   HGB 13.9 12/25/2022 1524   HCT 40.9 12/25/2022 1524   PLT 237 12/25/2022 1524   MCV 91.3 12/25/2022 1524   MCH 31.0 12/25/2022 1524   MCHC 34.0 12/25/2022 1524   RDW 13.0 12/25/2022 1524    Hgb A1C Lab Results  Component Value Date   HGBA1C 5.8 (H) 12/25/2022            Assessment & Plan:   Preventative health  maintenance:  Encouraged her to get a flu shot in the fall Tetanus UTD Encouraged her to get her COVID-vaccine Pap smear due 2026 Mammogram ordered-she will call to schedule Colon screening UTD Encouraged her to consume a balanced diet and exercise regimen Advised her to see an eye doctor and dentist annually We will check CBC, c-Met, lipid, A1c today  RTC in 6 months, follow-up chronic conditions Nicki Reaper, NP

## 2024-04-16 ENCOUNTER — Ambulatory Visit (INDEPENDENT_AMBULATORY_CARE_PROVIDER_SITE_OTHER): Admitting: Internal Medicine

## 2024-04-16 ENCOUNTER — Encounter: Payer: Self-pay | Admitting: Internal Medicine

## 2024-04-16 VITALS — BP 140/80 | Ht 68.0 in | Wt 259.0 lb

## 2024-04-16 DIAGNOSIS — Z0001 Encounter for general adult medical examination with abnormal findings: Secondary | ICD-10-CM

## 2024-04-16 DIAGNOSIS — E66812 Obesity, class 2: Secondary | ICD-10-CM

## 2024-04-16 DIAGNOSIS — Z6839 Body mass index (BMI) 39.0-39.9, adult: Secondary | ICD-10-CM

## 2024-04-16 DIAGNOSIS — I10 Essential (primary) hypertension: Secondary | ICD-10-CM

## 2024-04-16 DIAGNOSIS — E7800 Pure hypercholesterolemia, unspecified: Secondary | ICD-10-CM | POA: Diagnosis not present

## 2024-04-16 DIAGNOSIS — Z1231 Encounter for screening mammogram for malignant neoplasm of breast: Secondary | ICD-10-CM

## 2024-04-16 DIAGNOSIS — N920 Excessive and frequent menstruation with regular cycle: Secondary | ICD-10-CM

## 2024-04-16 DIAGNOSIS — R7303 Prediabetes: Secondary | ICD-10-CM

## 2024-04-16 MED ORDER — LABETALOL HCL 100 MG PO TABS: 100.0000 mg | ORAL_TABLET | Freq: Two times a day (BID) | ORAL | 1 refills | Status: DC

## 2024-04-16 NOTE — Assessment & Plan Note (Signed)
 Encourage diet and exercise for weight loss

## 2024-04-16 NOTE — Patient Instructions (Signed)

## 2024-04-16 NOTE — Assessment & Plan Note (Signed)
 Uncontrolled off labetalol , will refill today Reinforced DASH diet and exercise for weight loss

## 2024-04-16 NOTE — Progress Notes (Signed)
 Subjective:    Patient ID: Claudia Glenn, female    DOB: [birth year], 48 y.o.   MRN: [id number]  HPI  Patient presents to clinic today for her annual exam.  Of note, her BP today is 148/88.  She is not taking her labetalol  as prescribed.  Flu: Never Tetanus: 05/2022 COVID: Never Pap smear: 05/2022, abnormal, repeat in 3 years Mammogram: 06/2022 Colon screening: 05/2022, Cologuard Vision screening: annually Dentist: biannually  Diet: She does eat some meat. She does not eat a lot of fruits or veggies. She does eat some fried foods. She drinks mostly Mt. Dew. Exercise: None  Review of Systems     Past Medical History:  Diagnosis Date   Frequent headaches    Hypertension     Current Outpatient Medications  Medication Sig Dispense Refill   atorvastatin  (LIPITOR) 10 MG tablet Take 1 tablet (10 mg total) by mouth daily. 90 tablet 1   Ibuprofen 200 MG CAPS Take 400 mg by mouth as needed.     ipratropium (ATROVENT ) 0.03 % nasal spray Place 2 sprays into both nostrils every 12 (twelve) hours. 30 mL 0   labetalol  (NORMODYNE ) 100 MG tablet TAKE 1 TABLET BY MOUTH 3 TIMES A DAY 180 tablet 0   No current facility-administered medications for this visit.    Allergies  Allergen Reactions   Losartan  Other (See Comments)    Chest tightness   Tramadol  Other (See Comments)    dizziness    Family History  Problem Relation Age of Onset   Irregular heart beat Mother    Alcohol abuse Sister    Healthy Sister    Cancer Neg Hx    Diabetes Neg Hx    Heart disease Neg Hx    Stroke Neg Hx     Social History   Socioeconomic History   Marital status: Married    Spouse name: Not on file   Number of children: Not on file   Years of education: Not on file   Highest education level: Not on file  Occupational History   Not on file  Tobacco Use   Smoking status: Former    Current packs/day: 0.50    Types: Cigarettes   Smokeless tobacco: Never   Tobacco comments:    quit 2014   Vaping Use   Vaping status: Never Used  Substance and Sexual Activity   Alcohol use: Not Currently    Alcohol/week: 0.0 standard drinks of alcohol    Comment: rare   Drug use: No   Sexual activity: Yes    Birth control/protection: None  Other Topics Concern   Not on file  Social History Narrative   Not on file   Social Drivers of Health   Financial Resource Strain: Not on file  Food Insecurity: Not on file  Transportation Needs: Not on file  Physical Activity: Not on file  Stress: Not on file  Social Connections: Not on file  Intimate Partner Violence: Not on file     Constitutional: Patient reports intermittent headaches.  Denies fever, malaise, fatigue, or abrupt weight changes.  HEENT: Denies eye pain, eye redness, ear pain, ringing in the ears, wax buildup, runny nose, nasal congestion, bloody nose, or sore throat. Respiratory: Denies difficulty breathing, shortness of breath, cough or sputum production.   Cardiovascular: Denies chest pain, chest tightness, palpitations or swelling in the hands or feet.  Gastrointestinal: Denies abdominal pain, bloating, constipation, diarrhea or blood in the stool.  GU: Pt reports heavy menstrual periods.  Denies urgency, frequency, pain with urination, burning sensation, blood in urine, odor or discharge. Musculoskeletal: Denies decrease in range of motion, difficulty with gait, or joint swelling.  Skin: Denies redness, rashes, lesions or ulcercations.  Neurological: Denies dizziness, difficulty with memory, difficulty with speech or problems with balance and coordination.  Psych: Denies anxiety, depression, SI/HI.  No other specific complaints in a complete review of systems (except as listed in HPI above).  Objective:   Physical Exam   BP (!) 140/80   Ht 5\' 8"  (1.727 m)   Wt 259 lb (117.5 kg)   LMP 04/09/2024 (Approximate)   BMI 39.38 kg/m    Wt Readings from Last 3 Encounters:  12/25/22 241 lb (109.3 kg)  05/24/22 250 lb  (113.4 kg)  02/10/21 259 lb (117.5 kg)    General: Appears her stated age, obese, in NAD. Skin: Warm, dry and intact.  HEENT: Head: normal shape and size; Eyes: sclera white, no icterus, conjunctiva pink, PERRLA and EOMs intact;  Neck:  Neck supple, trachea midline. No masses, lumps or thyromegaly present.  Cardiovascular: Normal rate and rhythm. S1,S2 noted.  No murmur, rubs or gallops noted. No JVD or BLE edema.  Pulmonary/Chest: Normal effort and positive vesicular breath sounds. No respiratory distress. No wheezes, rales or ronchi noted.  Abdomen: Soft and nontender. Normal bowel sounds.  Musculoskeletal: Strength 5/5 BUE/BLE.  No difficulty with gait.  Neurological: Alert and oriented. Cranial nerves II-XII grossly intact. Coordination normal.  Psychiatric: Mood and affect normal. Behavior is normal. Judgment and thought content normal.    BMET    Component Value Date/Time   NA 140 12/25/2022 1524   K 4.1 12/25/2022 1524   CL 104 12/25/2022 1524   CO2 27 12/25/2022 1524   GLUCOSE 80 12/25/2022 1524   BUN 15 12/25/2022 1524   CREATININE 0.64 12/25/2022 1524   CALCIUM  9.5 12/25/2022 1524   GFRNONAA >60 10/02/2015 1212   GFRAA >60 10/02/2015 1212    Lipid Panel     Component Value Date/Time   CHOL 194 12/25/2022 1524   TRIG 158 (H) 12/25/2022 1524   HDL 48 (L) 12/25/2022 1524   CHOLHDL 4.0 12/25/2022 1524   VLDL 17.6 11/02/2020 1144   LDLCALC 119 (H) 12/25/2022 1524    CBC    Component Value Date/Time   WBC 12.6 (H) 12/25/2022 1524   RBC 4.48 12/25/2022 1524   HGB 13.9 12/25/2022 1524   HCT 40.9 12/25/2022 1524   PLT 237 12/25/2022 1524   MCV 91.3 12/25/2022 1524   MCH 31.0 12/25/2022 1524   MCHC 34.0 12/25/2022 1524   RDW 13.0 12/25/2022 1524    Hgb A1C Lab Results  Component Value Date   HGBA1C 5.8 (H) 12/25/2022           Assessment & Plan:   Preventative Health Maintenance:  Encouraged her to get a flu shot in the fall Tetanus  UTD Encouraged her to get a COVID-vaccine Pap smear UTD Mammogram ordered-she will call to schedule Colon screening UTD Encouraged her to consume a balanced diet and exercise regimen Advised her to see an eye doctor and dentist annually We will check CBC, c-Met, lipid, A1c today  Menorrhagia:  Likely due to the perimenopause or possibly fibroids TSH today Will obtain pelvic/transvaginal ultrasound for further evaluation  RTC in 2 weeks, followup HTN,  6 months, follow-up chronic conditions Helayne Lo, NP

## 2024-04-17 ENCOUNTER — Encounter: Payer: Self-pay | Admitting: Internal Medicine

## 2024-04-17 LAB — COMPREHENSIVE METABOLIC PANEL WITH GFR
AG Ratio: 2 (calc) (ref 1.0–2.5)
ALT: 15 U/L (ref 6–29)
AST: 15 U/L (ref 10–35)
Albumin: 4.4 g/dL (ref 3.6–5.1)
Alkaline phosphatase (APISO): 103 U/L (ref 31–125)
BUN: 11 mg/dL (ref 7–25)
CO2: 28 mmol/L (ref 20–32)
Calcium: 9.7 mg/dL (ref 8.6–10.2)
Chloride: 101 mmol/L (ref 98–110)
Creat: 0.82 mg/dL (ref 0.50–0.99)
Globulin: 2.2 g/dL (ref 1.9–3.7)
Glucose, Bld: 95 mg/dL (ref 65–99)
Potassium: 4.9 mmol/L (ref 3.5–5.3)
Sodium: 137 mmol/L (ref 135–146)
Total Bilirubin: 0.5 mg/dL (ref 0.2–1.2)
Total Protein: 6.6 g/dL (ref 6.1–8.1)
eGFR: 89 mL/min/{1.73_m2} (ref >=60)

## 2024-04-17 LAB — CBC
HCT: 47.7 % — ABNORMAL HIGH (ref 35.0–45.0)
Hemoglobin: 15.8 g/dL — ABNORMAL HIGH (ref 11.7–15.5)
MCH: 31.2 pg (ref 27.0–33.0)
MCHC: 33.1 g/dL (ref 32.0–36.0)
MCV: 94.3 fL (ref 80.0–100.0)
MPV: 10.7 fL (ref 7.5–12.5)
Platelets: 240 10*3/uL (ref 140–400)
RBC: 5.06 10*6/uL (ref 3.80–5.10)
RDW: 13.1 % (ref 11.0–15.0)
WBC: 10.6 10*3/uL (ref 3.8–10.8)

## 2024-04-17 LAB — LIPID PANEL
Cholesterol: 233 mg/dL — ABNORMAL HIGH (ref <200)
HDL: 63 mg/dL (ref >=50)
LDL Cholesterol (Calc): 145 mg/dL — ABNORMAL HIGH
Non-HDL Cholesterol (Calc): 170 mg/dL — ABNORMAL HIGH (ref <130)
Total CHOL/HDL Ratio: 3.7 (calc) (ref <5.0)
Triglycerides: 127 mg/dL (ref <150)

## 2024-04-17 LAB — HEMOGLOBIN A1C
Hgb A1c MFr Bld: 5.9 % — ABNORMAL HIGH (ref <5.7)
Mean Plasma Glucose: 123 mg/dL
eAG (mmol/L): 6.8 mmol/L

## 2024-04-17 LAB — TSH: TSH: 1.24 m[IU]/L

## 2024-04-18 ENCOUNTER — Telehealth: Payer: Self-pay | Admitting: Internal Medicine

## 2024-04-18 NOTE — Telephone Encounter (Unsigned)
 Copied from CRM [id number]. Topic: Clinical - Medication Refill >> Apr 18, 2024  9:41 AM Zipporah Him wrote: Medication: atorvastatin  (LIPITOR) 10 MG tablet  Has the patient contacted their pharmacy? Yes They do not have prescription   This is the patient's preferred pharmacy:  [facility] [address], [place] - [address] [place] [address] Phone: [phone] Fax: [phone]  Is this the correct pharmacy for this prescription? Yes If no, delete pharmacy and type the correct one.   Has the prescription been filled recently? No  Is the patient out of the medication? Yes  Has the patient been seen for an appointment in the last year OR does the patient have an upcoming appointment? Yes  Can we respond through MyChart? Yes  Agent: Please be advised that Rx refills may take up to 3 business days. We ask that you follow-up with your pharmacy.

## 2024-04-21 MED ORDER — ATORVASTATIN CALCIUM 10 MG PO TABS: 10.0000 mg | ORAL_TABLET | Freq: Every day | ORAL | 1 refills | Status: DC

## 2024-04-21 NOTE — Telephone Encounter (Signed)
 Restarted recommended per lab note- will refill Requested Prescriptions  Pending Prescriptions Disp Refills   atorvastatin  (LIPITOR) 10 MG tablet 90 tablet 1    Sig: Take 1 tablet (10 mg total) by mouth daily.     Cardiovascular:  Antilipid - Statins Failed - 04/21/2024  1:06 PM      Failed - Valid encounter within last 12 months    Recent Outpatient Visits           5 days ago Encounter for general adult medical examination with abnormal findings   [facility] [place], [place], NP              Failed - Lipid Panel in normal range within the last 12 months    Cholesterol  Date Value Ref Range Status  04/16/2024 233 (H) <200 mg/dL Final   LDL Cholesterol (Calc)  Date Value Ref Range Status  04/16/2024 145 (H) mg/dL (calc) Final    Comment:    Reference range: <100 . Desirable range <100 mg/dL for primary prevention;   <70 mg/dL for patients with CHD or diabetic patients  with > or = 2 CHD risk factors. Aaron Aas LDL-C is now calculated using the Martin-Hopkins  calculation, which is a validated novel method providing  better accuracy than the Friedewald equation in the  estimation of LDL-C.  Melinda Sprawls et al. Erroll Heard. 4098;119(14): 2061-2068  (http://education.[employer].com/faq/FAQ164)    HDL  Date Value Ref Range Status  04/16/2024 63 > OR = 50 mg/dL Final   Triglycerides  Date Value Ref Range Status  04/16/2024 127 <150 mg/dL Final         Passed - Patient is not pregnant

## 2024-04-21 NOTE — Telephone Encounter (Signed)
 Patient calling and states only the lebetalol was sent in, the atorvastatin  still has not been received. Advised rx still pending and of turnaround time. Patient verbalized understanding.

## 2024-04-23 ENCOUNTER — Ambulatory Visit

## 2024-04-23 ENCOUNTER — Ambulatory Visit
Admission: RE | Admit: 2024-04-23 | Discharge: 2024-04-23 | Disposition: A | Discharge status: Home / Self Care | Source: Ambulatory Visit | Attending: Internal Medicine | Admitting: Internal Medicine

## 2024-04-23 DIAGNOSIS — Z1231 Encounter for screening mammogram for malignant neoplasm of breast: Secondary | ICD-10-CM

## 2024-05-09 ENCOUNTER — Encounter: Payer: Self-pay | Admitting: Internal Medicine

## 2024-05-09 ENCOUNTER — Ambulatory Visit (INDEPENDENT_AMBULATORY_CARE_PROVIDER_SITE_OTHER): Payer: Self-pay | Admitting: Internal Medicine

## 2024-05-09 VITALS — BP 138/82 | Ht 68.0 in | Wt 262.0 lb

## 2024-05-09 DIAGNOSIS — E66812 Obesity, class 2: Secondary | ICD-10-CM | POA: Diagnosis not present

## 2024-05-09 DIAGNOSIS — Z6839 Body mass index (BMI) 39.0-39.9, adult: Secondary | ICD-10-CM | POA: Diagnosis not present

## 2024-05-09 DIAGNOSIS — I10 Essential (primary) hypertension: Secondary | ICD-10-CM

## 2024-05-09 MED ORDER — LABETALOL HCL 100 MG PO TABS: 100.0000 mg | ORAL_TABLET | Freq: Three times a day (TID) | ORAL | 0 refills | Status: DC

## 2024-05-09 NOTE — Assessment & Plan Note (Signed)
 Encourage diet and exercise for weight loss

## 2024-05-09 NOTE — Patient Instructions (Signed)
 Hypertension, Adult Hypertension is another name for high blood pressure. High blood pressure forces your heart to work harder to pump blood. This can cause problems over time. There are two numbers in a blood pressure reading. There is a top number (systolic) over a bottom number (diastolic). It is best to have a blood pressure that is below 120/80. What are the causes? The cause of this condition is not known. Some other conditions can lead to high blood pressure. What increases the risk? Some lifestyle factors can make you more likely to develop high blood pressure: Smoking. Not getting enough exercise or physical activity. Being overweight. Having too much fat, sugar, calories, or salt (sodium) in your diet. Drinking too much alcohol. Other risk factors include: Having any of these conditions: Heart disease. Diabetes. High cholesterol. Kidney disease. Obstructive sleep apnea. Having a family history of high blood pressure and high cholesterol. Age. The risk increases with age. Stress. What are the signs or symptoms? High blood pressure may not cause symptoms. Very high blood pressure (hypertensive crisis) may cause: Headache. Fast or uneven heartbeats (palpitations). Shortness of breath. Nosebleed. Vomiting or feeling like you may vomit (nauseous). Changes in how you see. Very bad chest pain. Feeling dizzy. Seizures. How is this treated? This condition is treated by making healthy lifestyle changes, such as: Eating healthy foods. Exercising more. Drinking less alcohol. Your doctor may prescribe medicine if lifestyle changes do not help enough and if: Your top number is above 130. Your bottom number is above 80. Your personal target blood pressure may vary. Follow these instructions at home: Eating and drinking  If told, follow the DASH eating plan. To follow this plan: Fill one half of your plate at each meal with fruits and vegetables. Fill one fourth of your plate  at each meal with whole grains. Whole grains include whole-wheat pasta, brown rice, and whole-grain bread. Eat or drink low-fat dairy products, such as skim milk or low-fat yogurt. Fill one fourth of your plate at each meal with low-fat (lean) proteins. Low-fat proteins include fish, chicken without skin, eggs, beans, and tofu. Avoid fatty meat, cured and processed meat, or chicken with skin. Avoid pre-made or processed food. Limit the amount of salt in your diet to less than 1,500 mg each day. Do not drink alcohol if: Your doctor tells you not to drink. You are pregnant, may be pregnant, or are planning to become pregnant. If you drink alcohol: Limit how much you have to: 0-1 drink a day for women. 0-2 drinks a day for men. Know how much alcohol is in your drink. In the [place] one drink equals one 12 oz bottle of beer (355 mL), one 5 oz glass of wine (148 mL), or one 1 oz glass of hard liquor (44 mL). Lifestyle  Work with your doctor to stay at a healthy weight or to lose weight. Ask your doctor what the best weight is for you. Get at least 30 minutes of exercise that causes your heart to beat faster (aerobic exercise) most days of the week. This may include walking, swimming, or biking. Get at least 30 minutes of exercise that strengthens your muscles (resistance exercise) at least 3 days a week. This may include lifting weights or doing Pilates. Do not smoke or use any products that contain nicotine or tobacco. If you need help quitting, ask your doctor. Check your blood pressure at home as told by your doctor. Keep all follow-up visits. Medicines Take over-the-counter and prescription medicines  only as told by your doctor. Follow directions carefully. Do not skip doses of blood pressure medicine. The medicine does not work as well if you skip doses. Skipping doses also puts you at risk for problems. Ask your doctor about side effects or reactions to medicines that you should watch  for. Contact a doctor if: You think you are having a reaction to the medicine you are taking. You have headaches that keep coming back. You feel dizzy. You have swelling in your ankles. You have trouble with your vision. Get help right away if: You get a very bad headache. You start to feel mixed up (confused). You feel weak or numb. You feel faint. You have very bad pain in your: Chest. Belly (abdomen). You vomit more than once. You have trouble breathing. These symptoms may be an emergency. Get help right away. Call 911. Do not wait to see if the symptoms will go away. Do not drive yourself to the hospital. Summary Hypertension is another name for high blood pressure. High blood pressure forces your heart to work harder to pump blood. For most people, a normal blood pressure is less than 120/80. Making healthy choices can help lower blood pressure. If your blood pressure does not get lower with healthy choices, you may need to take medicine. This information is not intended to replace advice given to you by your health care provider. Make sure you discuss any questions you have with your health care provider. Document Revised: 09/15/2021 Document Reviewed: 09/15/2021 Elsevier Patient Education  2024 [employer].

## 2024-05-09 NOTE — Assessment & Plan Note (Signed)
 Remains uncontrolled on labetalol  100 mg twice daily, will increase to 3 times daily Reinforced DASH diet and exercise for weight loss

## 2024-05-09 NOTE — Progress Notes (Signed)
 Subjective:    Patient ID: Claudia Glenn, female    DOB: 1976-11-24, 48 y.o.   MRN: [id number]  HPI  Patient presents to clinic today for 2-week follow-up of HTN.  At her last visit, she was restarted on her labetalol .  She has been taking medication as prescribed.  Her BP today is 154/90.  ECG from 09/2015 reviewed.  Review of Systems     Past Medical History:  Diagnosis Date   Frequent headaches    Hypertension     Current Outpatient Medications  Medication Sig Dispense Refill   atorvastatin  (LIPITOR) 10 MG tablet Take 1 tablet (10 mg total) by mouth daily. 90 tablet 1   Ibuprofen 200 MG CAPS Take 400 mg by mouth as needed.     labetalol  (NORMODYNE ) 100 MG tablet Take 1 tablet (100 mg total) by mouth 2 (two) times daily. 180 tablet 1   No current facility-administered medications for this visit.    Allergies  Allergen Reactions   Losartan  Other (See Comments)    Chest tightness   Tramadol  Other (See Comments)    dizziness    Family History  Problem Relation Age of Onset   Irregular heart beat Mother    Alcohol abuse Sister    Healthy Sister    Cancer Neg Hx    Diabetes Neg Hx    Heart disease Neg Hx    Stroke Neg Hx    Breast cancer Neg Hx     Social History   Socioeconomic History   Marital status: Married    Spouse name: Not on file   Number of children: Not on file   Years of education: Not on file   Highest education level: Not on file  Occupational History   Not on file  Tobacco Use   Smoking status: Former    Current packs/day: 0.50    Types: Cigarettes   Smokeless tobacco: Never   Tobacco comments:    quit 2014  Vaping Use   Vaping status: Never Used  Substance and Sexual Activity   Alcohol use: Not Currently    Alcohol/week: 0.0 standard drinks of alcohol    Comment: rare   Drug use: No   Sexual activity: Yes    Birth control/protection: None  Other Topics Concern   Not on file  Social History Narrative   Not on file   Social  Drivers of Health   Financial Resource Strain: Not on file  Food Insecurity: Not on file  Transportation Needs: Not on file  Physical Activity: Not on file  Stress: Not on file  Social Connections: Not on file  Intimate Partner Violence: Not on file     Constitutional: Patient reports intermittent headaches.  Denies fever, malaise, fatigue, or abrupt weight changes.  HEENT: Denies eye pain, eye redness, ear pain, ringing in the ears, wax buildup, runny nose, nasal congestion, bloody nose, or sore throat. Respiratory: Denies difficulty breathing, shortness of breath, cough or sputum production.   Cardiovascular: Denies chest pain, chest tightness, palpitations or swelling in the hands or feet.  Musculoskeletal: Denies decrease in range of motion, difficulty with gait, or joint swelling.  Neurological: Denies dizziness, difficulty with memory, difficulty with speech or problems with balance and coordination.   No other specific complaints in a complete review of systems (except as listed in HPI above).  Objective:   Physical Exam  BP 138/82   Ht 5\' 8"  (1.727 m)   Wt 262 lb (118.8 kg)  LMP 04/28/2024 (Approximate)   BMI 39.84 kg/m     Wt Readings from Last 3 Encounters:  04/16/24 259 lb (117.5 kg)  12/25/22 241 lb (109.3 kg)  05/24/22 250 lb (113.4 kg)    General: Appears her stated age, obese, in NAD. Skin: Warm, dry and intact.  HEENT: Head: normal shape and size; Eyes: sclera white, no icterus, conjunctiva pink, PERRLA and EOMs intact;  Cardiovascular: Normal rate and rhythm. S1,S2 noted.  No murmur, rubs or gallops noted. No JVD or BLE edema.  Pulmonary/Chest: Normal effort and positive vesicular breath sounds. No respiratory distress. No wheezes, rales or ronchi noted.  Musculoskeletal: No difficulty with gait.  Neurological: Alert and oriented. Coordination normal.   BMET    Component Value Date/Time   NA 137 04/16/2024 0928   K 4.9 04/16/2024 0928   CL 101  04/16/2024 0928   CO2 28 04/16/2024 0928   GLUCOSE 95 04/16/2024 0928   BUN 11 04/16/2024 0928   CREATININE 0.82 04/16/2024 0928   CALCIUM  9.7 04/16/2024 0928   GFRNONAA >60 10/02/2015 1212   GFRAA >60 10/02/2015 1212    Lipid Panel     Component Value Date/Time   CHOL 233 (H) 04/16/2024 0928   TRIG 127 04/16/2024 0928   HDL 63 04/16/2024 0928   CHOLHDL 3.7 04/16/2024 0928   VLDL 17.6 11/02/2020 1144   LDLCALC 145 (H) 04/16/2024 0928    CBC    Component Value Date/Time   WBC 10.6 04/16/2024 0928   RBC 5.06 04/16/2024 0928   HGB 15.8 (H) 04/16/2024 0928   HCT 47.7 (H) 04/16/2024 0928   PLT 240 04/16/2024 0928   MCV 94.3 04/16/2024 0928   MCH 31.2 04/16/2024 0928   MCHC 33.1 04/16/2024 0928   RDW 13.1 04/16/2024 0928    Hgb A1C Lab Results  Component Value Date   HGBA1C 5.9 (H) 04/16/2024           Assessment & Plan:    RTC in 6 months, follow-up chronic conditions Helayne Lo, NP

## 2024-06-27 ENCOUNTER — Encounter: Payer: Self-pay | Admitting: Advanced Practice Midwife

## 2024-10-15 ENCOUNTER — Other Ambulatory Visit: Payer: Self-pay | Admitting: Internal Medicine

## 2024-10-16 NOTE — Telephone Encounter (Signed)
 Requested Prescriptions  Pending Prescriptions Disp Refills   atorvastatin  (LIPITOR) 10 MG tablet [Pharmacy Med Name: Atorvastatin  Calcium  10 MG Oral Tablet] 90 tablet 0    Sig: Take 1 tablet by mouth once daily     Cardiovascular:  Antilipid - Statins Failed - 10/16/2024  3:18 PM      Failed - Lipid Panel in normal range within the last 12 months    Cholesterol  Date Value Ref Range Status  04/16/2024 233 (H) <200 mg/dL Final   LDL Cholesterol (Calc)  Date Value Ref Range Status  04/16/2024 145 (H) mg/dL (calc) Final    Comment:    Reference range: <100 . Desirable range <100 mg/dL for primary prevention;   <70 mg/dL for patients with CHD or diabetic patients  with > or = 2 CHD risk factors. SABRA LDL-C is now calculated using the Martin-Hopkins  calculation, which is a validated novel method providing  better accuracy than the Friedewald equation in the  estimation of LDL-C.  Gladis APPLETHWAITE et al. SANDREA. 7986;689(80): 2061-2068  (http://education.[facility].com/faq/FAQ164)    HDL  Date Value Ref Range Status  04/16/2024 63 > OR = 50 mg/dL Final   Triglycerides  Date Value Ref Range Status  04/16/2024 127 <150 mg/dL Final         Passed - Patient is not pregnant      Passed - Valid encounter within last 12 months    Recent Outpatient Visits           5 months ago Primary hypertension   [facility] [place], Angeline ORN, NP   6 months ago Encounter for general adult medical examination with abnormal findings   [facility] [place], Angeline ORN, NP

## 2024-11-12 ENCOUNTER — Ambulatory Visit: Admitting: Internal Medicine

## 2024-11-18 ENCOUNTER — Ambulatory Visit: Admitting: Internal Medicine

## 2024-11-20 ENCOUNTER — Encounter: Payer: Self-pay | Admitting: Internal Medicine

## 2024-11-20 ENCOUNTER — Ambulatory Visit: Admitting: Internal Medicine

## 2024-11-20 VITALS — BP 144/90 | Ht 68.0 in | Wt 263.2 lb

## 2024-11-20 DIAGNOSIS — E7800 Pure hypercholesterolemia, unspecified: Secondary | ICD-10-CM

## 2024-11-20 DIAGNOSIS — R7303 Prediabetes: Secondary | ICD-10-CM

## 2024-11-20 DIAGNOSIS — G8929 Other chronic pain: Secondary | ICD-10-CM | POA: Diagnosis not present

## 2024-11-20 DIAGNOSIS — R519 Headache, unspecified: Secondary | ICD-10-CM | POA: Diagnosis not present

## 2024-11-20 DIAGNOSIS — I10 Essential (primary) hypertension: Secondary | ICD-10-CM

## 2024-11-20 DIAGNOSIS — D751 Secondary polycythemia: Secondary | ICD-10-CM | POA: Insufficient documentation

## 2024-11-20 DIAGNOSIS — M542 Cervicalgia: Secondary | ICD-10-CM

## 2024-11-20 MED ORDER — LABETALOL HCL 100 MG PO TABS: 100.0000 mg | ORAL_TABLET | Freq: Three times a day (TID) | ORAL | 1 refills | Status: AC

## 2024-11-20 MED ORDER — HYDRALAZINE HCL 25 MG PO TABS: 25.0000 mg | ORAL_TABLET | Freq: Two times a day (BID) | ORAL | 1 refills | Status: AC

## 2024-11-20 NOTE — Assessment & Plan Note (Signed)
 Encourage diet and exercise for weight loss

## 2024-11-20 NOTE — Assessment & Plan Note (Signed)
 Resolved  We will monitor

## 2024-11-20 NOTE — Assessment & Plan Note (Signed)
Encourage regular stretching 

## 2024-11-20 NOTE — Patient Instructions (Signed)

## 2024-11-20 NOTE — Progress Notes (Signed)
 Subjective:    Patient ID: Claudia Glenn, female    DOB: 1976/03/10, 48 y.o.   MRN: [id number]  HPI  Patient presents to clinic today for 22-month follow-up of chronic conditions.  HTN: Her BP today is 140/84.  She is taking labetalol  as prescribed.  She does not check her blood pressure at home.  ECG from 09/2015 reviewed.  HLD: Her last LDL was 145, triglycerides 127, 04/2024.  She denies myalgias on atorvastatin .  She tries to consume a low-fat diet.  Prediabetes: Her last A1c was 5.9%, 04/2024.  She is not taking any oral diabetic medication at this time.  She does not check her sugars.  Frequent headaches secondary to chronic neck pain: These no longer occur.  She feels like this has improved since she retired, possibly also stress related.  She is not taking any medication OTC for this.  She does not follow with orthopedics.  Polycythemia: Her last H/H was 15.8/47.7, 04/2024.  She does smoke.  She does not follow with hematology.    Review of Systems     Past Medical History:  Diagnosis Date   Frequent headaches    Hypertension     Current Outpatient Medications  Medication Sig Dispense Refill   atorvastatin  (LIPITOR) 10 MG tablet Take 1 tablet by mouth once daily 90 tablet 0   Ibuprofen 200 MG CAPS Take 400 mg by mouth as needed.     labetalol  (NORMODYNE ) 100 MG tablet Take 1 tablet (100 mg total) by mouth 3 (three) times daily. 270 tablet 0   No current facility-administered medications for this visit.    Allergies  Allergen Reactions   Losartan  Other (See Comments)    Chest tightness   Tramadol  Other (See Comments)    dizziness    Family History  Problem Relation Age of Onset   Irregular heart beat Mother    Alcohol abuse Sister    Healthy Sister    Cancer Neg Hx    Diabetes Neg Hx    Heart disease Neg Hx    Stroke Neg Hx    Breast cancer Neg Hx     Social History   Socioeconomic History   Marital status: Married    Spouse name: Not on file    Number of children: Not on file   Years of education: Not on file   Highest education level: Not on file  Occupational History   Not on file  Tobacco Use   Smoking status: Former    Current packs/day: 0.50    Types: Cigarettes   Smokeless tobacco: Never   Tobacco comments:    quit 2014  Vaping Use   Vaping status: Never Used  Substance and Sexual Activity   Alcohol use: Not Currently    Alcohol/week: 0.0 standard drinks of alcohol    Comment: rare   Drug use: No   Sexual activity: Yes    Birth control/protection: None  Other Topics Concern   Not on file  Social History Narrative   Not on file   Social Drivers of Health   Tobacco Use: Medium Risk (05/09/2024)   Patient History    Smoking Tobacco Use: Former    Smokeless Tobacco Use: Never    Passive Exposure: Not on Actuary Strain: Not on file  Food Insecurity: Not on file  Transportation Needs: Not on file  Physical Activity: Not on file  Stress: Not on file  Social Connections: Not on file  Intimate Partner Violence:  Not on file  Depression (PHQ2-9): Low Risk (05/09/2024)   Depression (PHQ2-9)    PHQ-2 Score: 0  Alcohol Screen: Low Risk (05/24/2022)   Alcohol Screen    Last Alcohol Screening Score (AUDIT): 0  Housing: Not on file  Utilities: Not on file  Health Literacy: Not on file     Constitutional: Denies fever, malaise, fatigue, headaches or abrupt weight changes.  HEENT: Denies eye pain, eye redness, ear pain, ringing in the ears, wax buildup, runny nose, nasal congestion, bloody nose, or sore throat. Respiratory: Denies difficulty breathing, shortness of breath, cough or sputum production.   Cardiovascular: Denies chest pain, chest tightness, palpitations or swelling in the hands or feet.  Gastrointestinal: Denies abdominal pain, bloating, constipation, diarrhea or blood in the stool.  GU: Denies urgency, frequency, pain with urination, burning sensation, blood in urine, odor or  discharge. Musculoskeletal: Patient reports intermittent neck pain.  Denies decrease in range of motion, difficulty with gait, or joint swelling.  Skin: Denies redness, rashes, lesions or ulcercations.  Neurological: Denies dizziness, difficulty with memory, difficulty with speech or problems with balance and coordination.  Psych: Denies anxiety, depression, SI/HI.  No other specific complaints in a complete review of systems (except as listed in HPI above).  Objective:   Physical Exam  BP (!) 144/90   Ht 5' 8 (1.727 m)   Wt 263 lb 3.2 oz (119.4 kg)   LMP 11/09/2024 (Approximate)   BMI 40.02 kg/m    Wt Readings from Last 3 Encounters:  05/09/24 262 lb (118.8 kg)  04/16/24 259 lb (117.5 kg)  12/25/22 241 lb (109.3 kg)    General: Appears her stated age, obese, in NAD. Skin: Warm, dry and intact.  HEENT: Head: normal shape and size; Eyes: sclera white, no icterus, conjunctiva pink, PERRLA and EOMs intact;  Cardiovascular: Normal rate and rhythm. S1,S2 noted.  No murmur, rubs or gallops noted. No JVD or BLE edema.  Pulmonary/Chest: Normal effort and positive vesicular breath sounds. No respiratory distress. No wheezes, rales or ronchi noted.  Musculoskeletal: No difficulty with gait.  Neurological: Alert and oriented.  Psychiatric: Mood and affect normal. Behavior is normal. Judgment and thought content normal.    BMET    Component Value Date/Time   NA 137 04/16/2024 0928   K 4.9 04/16/2024 0928   CL 101 04/16/2024 0928   CO2 28 04/16/2024 0928   GLUCOSE 95 04/16/2024 0928   BUN 11 04/16/2024 0928   CREATININE 0.82 04/16/2024 0928   CALCIUM  9.7 04/16/2024 0928   GFRNONAA >60 10/02/2015 1212   GFRAA >60 10/02/2015 1212    Lipid Panel     Component Value Date/Time   CHOL 233 (H) 04/16/2024 0928   TRIG 127 04/16/2024 0928   HDL 63 04/16/2024 0928   CHOLHDL 3.7 04/16/2024 0928   VLDL 17.6 11/02/2020 1144   LDLCALC 145 (H) 04/16/2024 0928    CBC    Component  Value Date/Time   WBC 10.6 04/16/2024 0928   RBC 5.06 04/16/2024 0928   HGB 15.8 (H) 04/16/2024 0928   HCT 47.7 (H) 04/16/2024 0928   PLT 240 04/16/2024 0928   MCV 94.3 04/16/2024 0928   MCH 31.2 04/16/2024 0928   MCHC 33.1 04/16/2024 0928   RDW 13.1 04/16/2024 0928    Hgb A1C Lab Results  Component Value Date   HGBA1C 5.9 (H) 04/16/2024            Assessment & Plan:     RTC in 2  weeks follow-up HTN, 6 months for your annual exam Angeline Laura, NP

## 2024-11-20 NOTE — Assessment & Plan Note (Signed)
 Congratulated her on smoking cessation Will check CBC and iron panel today

## 2024-11-20 NOTE — Assessment & Plan Note (Addendum)
 Complicated by morbid obesity Remains uncontrolled on labetalol  100 mg 3 times daily Will add hydralazine 25 mg twice daily Reinforced DASH diet and exercise for weight loss C-Met today

## 2024-11-20 NOTE — Assessment & Plan Note (Signed)
 Complicated by morbid obesity A1c today Encourage low-carb diet and exercise for weight loss

## 2024-11-20 NOTE — Assessment & Plan Note (Signed)
 Complicated by morbid obesity C-Met and lipid profile today Encouraged her to consume low-fat diet Continue atorvastatin  10 mg daily

## 2024-11-21 ENCOUNTER — Ambulatory Visit: Payer: Self-pay | Admitting: Internal Medicine

## 2024-11-21 LAB — COMPREHENSIVE METABOLIC PANEL WITH GFR
AG Ratio: 1.8 (calc) (ref 1.0–2.5)
ALT: 24 U/L (ref 6–29)
AST: 16 U/L (ref 10–35)
Albumin: 4.4 g/dL (ref 3.6–5.1)
Alkaline phosphatase (APISO): 137 U/L — ABNORMAL HIGH (ref 31–125)
BUN: 12 mg/dL (ref 7–25)
CO2: 25 mmol/L (ref 20–32)
Calcium: 9.5 mg/dL (ref 8.6–10.2)
Chloride: 105 mmol/L (ref 98–110)
Creat: 0.79 mg/dL (ref 0.50–0.99)
Globulin: 2.4 g/dL (ref 1.9–3.7)
Glucose, Bld: 95 mg/dL (ref 65–139)
Potassium: 4.6 mmol/L (ref 3.5–5.3)
Sodium: 140 mmol/L (ref 135–146)
Total Bilirubin: 0.5 mg/dL (ref 0.2–1.2)
Total Protein: 6.8 g/dL (ref 6.1–8.1)
eGFR: 92 mL/min/1.73m2 (ref >=60)

## 2024-11-21 LAB — CBC
HCT: 45.8 % (ref 35.9–46.0)
Hemoglobin: 15.4 g/dL (ref 11.7–15.5)
MCH: 31.5 pg (ref 27.0–33.0)
MCHC: 33.6 g/dL (ref 31.6–35.4)
MCV: 93.7 fL (ref 81.4–101.7)
MPV: 10.9 fL (ref 7.5–12.5)
Platelets: 268 Thousand/uL (ref 140–400)
RBC: 4.89 Million/uL (ref 3.80–5.10)
RDW: 13 % (ref 11.0–15.0)
WBC: 10.7 Thousand/uL (ref 3.8–10.8)

## 2024-11-21 LAB — IRON,TIBC AND FERRITIN PANEL
%SAT: 25 % (ref 16–45)
Ferritin: 35 ng/mL (ref 16–232)
Iron: 96 ug/dL (ref 40–190)
TIBC: 382 ug/dL (ref 250–450)

## 2024-11-21 LAB — LIPID PANEL
Cholesterol: 163 mg/dL (ref <200)
HDL: 55 mg/dL (ref >=50)
LDL Cholesterol (Calc): 82 mg/dL
Non-HDL Cholesterol (Calc): 108 mg/dL (ref <130)
Total CHOL/HDL Ratio: 3 (calc) (ref <5.0)
Triglycerides: 154 mg/dL — ABNORMAL HIGH (ref <150)

## 2024-11-21 LAB — HEMOGLOBIN A1C
Hgb A1c MFr Bld: 5.8 % — ABNORMAL HIGH (ref <5.7)
Mean Plasma Glucose: 120 mg/dL
eAG (mmol/L): 6.6 mmol/L

## 2024-12-01 NOTE — Progress Notes (Unsigned)
 "  Subjective:    Patient ID: Claudia Glenn, female    DOB: December 11, 1976, 48 y.o.   MRN: [id number]  HPI   Discussed the use of AI scribe software for clinical note transcription with the patient, who gave verbal consent to proceed.    Claudia Glenn is a 48 year old female with hypertension who presents for a follow-up on her blood pressure management.  She is currently taking labetalol  100 mg three times a day and hydralazine  25 mg twice a day. Hydralazine  was recently added to her regimen.  She denies adverse side effects.  Her most recent blood pressure reading was 138/80 mmHg.         Review of Systems     Past Medical History:  Diagnosis Date   Frequent headaches    Hypertension     Current Outpatient Medications  Medication Sig Dispense Refill   atorvastatin  (LIPITOR) 10 MG tablet Take 1 tablet by mouth once daily 90 tablet 0   hydrALAZINE  (APRESOLINE ) 25 MG tablet Take 1 tablet (25 mg total) by mouth in the morning and at bedtime. 180 tablet 1   Ibuprofen 200 MG CAPS Take 400 mg by mouth as needed.     labetalol  (NORMODYNE ) 100 MG tablet Take 1 tablet (100 mg total) by mouth 3 (three) times daily. 270 tablet 1   No current facility-administered medications for this visit.    Allergies  Allergen Reactions   Losartan  Other (See Comments)    Chest tightness   Tramadol  Other (See Comments)    dizziness    Family History  Problem Relation Age of Onset   Irregular heart beat Mother    Alcohol abuse Sister    Healthy Sister    Cancer Neg Hx    Diabetes Neg Hx    Heart disease Neg Hx    Stroke Neg Hx    Breast cancer Neg Hx     Social History   Socioeconomic History   Marital status: Married    Spouse name: Not on file   Number of children: Not on file   Years of education: Not on file   Highest education level: Not on file  Occupational History   Not on file  Tobacco Use   Smoking status: Former    Current packs/day: 0.50    Types:  Cigarettes   Smokeless tobacco: Never   Tobacco comments:    quit 2014  Vaping Use   Vaping status: Never Used  Substance and Sexual Activity   Alcohol use: Not Currently    Alcohol/week: 0.0 standard drinks of alcohol    Comment: rare   Drug use: No   Sexual activity: Yes    Birth control/protection: None  Other Topics Concern   Not on file  Social History Narrative   Not on file   Social Drivers of Health   Tobacco Use: Medium Risk (11/20/2024)   Patient History    Smoking Tobacco Use: Former    Smokeless Tobacco Use: Never    Passive Exposure: Not on Actuary Strain: Not on file  Food Insecurity: Not on file  Transportation Needs: Not on file  Physical Activity: Not on file  Stress: Not on file  Social Connections: Not on file  Intimate Partner Violence: Not on file  Depression (PHQ2-9): Low Risk (11/20/2024)   Depression (PHQ2-9)    PHQ-2 Score: 0  Alcohol Screen: Low Risk (05/24/2022)   Alcohol Screen    Last Alcohol Screening Score (  AUDIT): 0  Housing: Not on file  Utilities: Not on file  Health Literacy: Not on file     Constitutional: Denies fever, malaise, fatigue, headaches or abrupt weight changes.  HEENT: Denies eye pain, eye redness, ear pain, ringing in the ears, wax buildup, runny nose, nasal congestion, bloody nose, or sore throat. Respiratory: Denies difficulty breathing, shortness of breath, cough or sputum production.   Cardiovascular: Denies chest pain, chest tightness, palpitations or swelling in the hands or feet.  Gastrointestinal: Denies abdominal pain, bloating, constipation, diarrhea or blood in the stool.  GU: Denies urgency, frequency, pain with urination, burning sensation, blood in urine, odor or discharge. Musculoskeletal: Patient reports intermittent neck pain.  Denies decrease in range of motion, difficulty with gait, or joint swelling.  Skin: Denies redness, rashes, lesions or ulcercations.  Neurological: Denies  dizziness, difficulty with memory, difficulty with speech or problems with balance and coordination.  Psych: Denies anxiety, depression, SI/HI.  No other specific complaints in a complete review of systems (except as listed in HPI above).  Objective:   Physical Exam  LMP 11/09/2024 (Approximate)    Wt Readings from Last 3 Encounters:  11/20/24 263 lb 3.2 oz (119.4 kg)  05/09/24 262 lb (118.8 kg)  04/16/24 259 lb (117.5 kg)    General: Appears her stated age, obese, in NAD. Skin: Warm, dry and intact.  HEENT: Head: normal shape and size; Eyes: sclera white, no icterus, conjunctiva pink, PERRLA and EOMs intact;  Cardiovascular: Normal rate and rhythm. S1,S2 noted.  No murmur, rubs or gallops noted. No JVD or BLE edema.  Pulmonary/Chest: Normal effort and positive vesicular breath sounds. No respiratory distress. No wheezes, rales or ronchi noted.  Musculoskeletal: No difficulty with gait.  Neurological: Alert and oriented.  Psychiatric: Mood and affect normal. Behavior is normal. Judgment and thought content normal.    BMET    Component Value Date/Time   NA 140 11/20/2024 1100   K 4.6 11/20/2024 1100   CL 105 11/20/2024 1100   CO2 25 11/20/2024 1100   GLUCOSE 95 11/20/2024 1100   BUN 12 11/20/2024 1100   CREATININE 0.79 11/20/2024 1100   CALCIUM  9.5 11/20/2024 1100   GFRNONAA >60 10/02/2015 1212   GFRAA >60 10/02/2015 1212    Lipid Panel     Component Value Date/Time   CHOL 163 11/20/2024 1100   TRIG 154 (H) 11/20/2024 1100   HDL 55 11/20/2024 1100   CHOLHDL 3.0 11/20/2024 1100   VLDL 17.6 11/02/2020 1144   LDLCALC 82 11/20/2024 1100    CBC    Component Value Date/Time   WBC 10.7 11/20/2024 1100   RBC 4.89 11/20/2024 1100   HGB 15.4 11/20/2024 1100   HCT 45.8 11/20/2024 1100   PLT 268 11/20/2024 1100   MCV 93.7 11/20/2024 1100   MCH 31.5 11/20/2024 1100   MCHC 33.6 11/20/2024 1100   RDW 13.0 11/20/2024 1100    Hgb A1C Lab Results  Component Value  Date   HGBA1C 5.8 (H) 11/20/2024            Assessment & Plan:     RTC in  5 months for your annual exam Angeline Laura, NP  "

## 2024-12-02 ENCOUNTER — Ambulatory Visit (INDEPENDENT_AMBULATORY_CARE_PROVIDER_SITE_OTHER): Admitting: Internal Medicine

## 2024-12-02 ENCOUNTER — Encounter: Payer: Self-pay | Admitting: Internal Medicine

## 2024-12-02 VITALS — BP 138/80 | HR 59 | Ht 68.0 in | Wt 262.8 lb

## 2024-12-02 DIAGNOSIS — I10 Essential (primary) hypertension: Secondary | ICD-10-CM

## 2024-12-02 DIAGNOSIS — E66812 Obesity, class 2: Secondary | ICD-10-CM

## 2024-12-02 DIAGNOSIS — Z6839 Body mass index (BMI) 39.0-39.9, adult: Secondary | ICD-10-CM

## 2024-12-02 NOTE — Patient Instructions (Signed)
 Hypertension, Adult Hypertension is another name for high blood pressure. High blood pressure forces your heart to work harder to pump blood. This can cause problems over time. There are two numbers in a blood pressure reading. There is a top number (systolic) over a bottom number (diastolic). It is best to have a blood pressure that is below 120/80. What are the causes? The cause of this condition is not known. Some other conditions can lead to high blood pressure. What increases the risk? Some lifestyle factors can make you more likely to develop high blood pressure: Smoking. Not getting enough exercise or physical activity. Being overweight. Having too much fat, sugar, calories, or salt (sodium) in your diet. Drinking too much alcohol. Other risk factors include: Having any of these conditions: Heart disease. Diabetes. High cholesterol. Kidney disease. Obstructive sleep apnea. Having a family history of high blood pressure and high cholesterol. Age. The risk increases with age. Stress. What are the signs or symptoms? High blood pressure may not cause symptoms. Very high blood pressure (hypertensive crisis) may cause: Headache. Fast or uneven heartbeats (palpitations). Shortness of breath. Nosebleed. Vomiting or feeling like you may vomit (nauseous). Changes in how you see. Very bad chest pain. Feeling dizzy. Seizures. How is this treated? This condition is treated by making healthy lifestyle changes, such as: Eating healthy foods. Exercising more. Drinking less alcohol. Your doctor may prescribe medicine if lifestyle changes do not help enough and if: Your top number is above 130. Your bottom number is above 80. Your personal target blood pressure may vary. Follow these instructions at home: Eating and drinking  If told, follow the DASH eating plan. To follow this plan: Fill one half of your plate at each meal with fruits and vegetables. Fill one fourth of your plate  at each meal with whole grains. Whole grains include whole-wheat pasta, brown rice, and whole-grain bread. Eat or drink low-fat dairy products, such as skim milk or low-fat yogurt. Fill one fourth of your plate at each meal with low-fat (lean) proteins. Low-fat proteins include fish, chicken without skin, eggs, beans, and tofu. Avoid fatty meat, cured and processed meat, or chicken with skin. Avoid pre-made or processed food. Limit the amount of salt in your diet to less than 1,500 mg each day. Do not drink alcohol if: Your doctor tells you not to drink. You are pregnant, may be pregnant, or are planning to become pregnant. If you drink alcohol: Limit how much you have to: 0-1 drink a day for women. 0-2 drinks a day for men. Know how much alcohol is in your drink. In the [place] one drink equals one 12 oz bottle of beer (355 mL), one 5 oz glass of wine (148 mL), or one 1 oz glass of hard liquor (44 mL). Lifestyle  Work with your doctor to stay at a healthy weight or to lose weight. Ask your doctor what the best weight is for you. Get at least 30 minutes of exercise that causes your heart to beat faster (aerobic exercise) most days of the week. This may include walking, swimming, or biking. Get at least 30 minutes of exercise that strengthens your muscles (resistance exercise) at least 3 days a week. This may include lifting weights or doing Pilates. Do not smoke or use any products that contain nicotine or tobacco. If you need help quitting, ask your doctor. Check your blood pressure at home as told by your doctor. Keep all follow-up visits. Medicines Take over-the-counter and prescription medicines  only as told by your doctor. Follow directions carefully. Do not skip doses of blood pressure medicine. The medicine does not work as well if you skip doses. Skipping doses also puts you at risk for problems. Ask your doctor about side effects or reactions to medicines that you should watch  for. Contact a doctor if: You think you are having a reaction to the medicine you are taking. You have headaches that keep coming back. You feel dizzy. You have swelling in your ankles. You have trouble with your vision. Get help right away if: You get a very bad headache. You start to feel mixed up (confused). You feel weak or numb. You feel faint. You have very bad pain in your: Chest. Belly (abdomen). You vomit more than once. You have trouble breathing. These symptoms may be an emergency. Get help right away. Call 911. Do not wait to see if the symptoms will go away. Do not drive yourself to the hospital. Summary Hypertension is another name for high blood pressure. High blood pressure forces your heart to work harder to pump blood. For most people, a normal blood pressure is less than 120/80. Making healthy choices can help lower blood pressure. If your blood pressure does not get lower with healthy choices, you may need to take medicine. This information is not intended to replace advice given to you by your health care provider. Make sure you discuss any questions you have with your health care provider. Document Revised: 09/15/2021 Document Reviewed: 09/15/2021 Elsevier Patient Education  2024 [employer].

## 2024-12-02 NOTE — Assessment & Plan Note (Signed)
 Complicated by obesity Controlled on labetalol  100 mg 3 times daily and hydralazine  25 mg twice daily Reinforced DASH diet and exercise for weight loss

## 2024-12-02 NOTE — Assessment & Plan Note (Signed)
 Encourage diet and exercise for weight loss

## 2025-01-13 ENCOUNTER — Other Ambulatory Visit: Payer: Self-pay | Admitting: Internal Medicine

## 2025-01-14 NOTE — Telephone Encounter (Signed)
 Requested Prescriptions  Pending Prescriptions Disp Refills   atorvastatin  (LIPITOR) 10 MG tablet [Pharmacy Med Name: Atorvastatin  Calcium  10 MG Oral Tablet] 90 tablet 1    Sig: Take 1 tablet by mouth once daily     Cardiovascular:  Antilipid - Statins Failed - 01/14/2025  1:18 PM      Failed - Lipid Panel in normal range within the last 12 months    Cholesterol  Date Value Ref Range Status  11/20/2024 163 <200 mg/dL Final   LDL Cholesterol (Calc)  Date Value Ref Range Status  11/20/2024 82 mg/dL (calc) Final    Comment:    Reference range: <100 . Desirable range <100 mg/dL for primary prevention;   <70 mg/dL for patients with CHD or diabetic patients  with > or = 2 CHD risk factors. SABRA LDL-C is now calculated using the Martin-Hopkins  calculation, which is a validated novel method providing  better accuracy than the Friedewald equation in the  estimation of LDL-C.  Gladis APPLETHWAITE et al. SANDREA. 7986;689(80): 2061-2068  (http://education.[facility].com/faq/FAQ164)    HDL  Date Value Ref Range Status  11/20/2024 55 > OR = 50 mg/dL Final   Triglycerides  Date Value Ref Range Status  11/20/2024 154 (H) <150 mg/dL Final         Passed - Patient is not pregnant      Passed - Valid encounter within last 12 months    Recent Outpatient Visits           1 month ago Primary hypertension   [place] [facility] [place], Angeline ORN, NP   1 month ago Pure hypercholesterolemia   [place] [facility] [place], Angeline ORN, NP   8 months ago Primary hypertension   [facility] [place], Angeline ORN, NP   9 months ago Encounter for general adult medical examination with abnormal findings    [facility] [place], Angeline ORN, NP

## 2025-04-21 ENCOUNTER — Encounter: Admitting: Internal Medicine
